# Patient Record
Sex: Male | Born: 1950 | Race: White | Hispanic: No | Marital: Married | Smoking: Never smoker
Health system: Southern US, Community
[De-identification: ages and names within clinical notes are randomized; demographics above are authoritative.]

## PROBLEM LIST (undated history)

## (undated) DIAGNOSIS — G629 Polyneuropathy, unspecified: Secondary | ICD-10-CM

## (undated) DIAGNOSIS — I10 Essential (primary) hypertension: Secondary | ICD-10-CM

## (undated) DIAGNOSIS — E1122 Type 2 diabetes mellitus with diabetic chronic kidney disease: Secondary | ICD-10-CM

## (undated) DIAGNOSIS — N183 Chronic kidney disease, stage 3 (moderate): Secondary | ICD-10-CM

## (undated) DIAGNOSIS — E119 Type 2 diabetes mellitus without complications: Secondary | ICD-10-CM

## (undated) DIAGNOSIS — I639 Cerebral infarction, unspecified: Secondary | ICD-10-CM

## (undated) DIAGNOSIS — E7800 Pure hypercholesterolemia, unspecified: Secondary | ICD-10-CM

## (undated) DIAGNOSIS — E538 Deficiency of other specified B group vitamins: Secondary | ICD-10-CM

## (undated) HISTORY — DX: Cerebral infarction, unspecified: I63.9

---

## 2013-07-28 ENCOUNTER — Encounter: Payer: Self-pay | Admitting: Cardiology

## 2013-07-31 ENCOUNTER — Encounter: Payer: Self-pay | Admitting: General Surgery

## 2013-11-15 DIAGNOSIS — I10 Essential (primary) hypertension: Secondary | ICD-10-CM | POA: Diagnosis not present

## 2013-11-29 ENCOUNTER — Ambulatory Visit: Payer: Self-pay | Admitting: Cardiology

## 2014-03-27 DIAGNOSIS — L255 Unspecified contact dermatitis due to plants, except food: Secondary | ICD-10-CM | POA: Diagnosis not present

## 2014-05-07 DIAGNOSIS — E538 Deficiency of other specified B group vitamins: Secondary | ICD-10-CM | POA: Diagnosis not present

## 2014-05-07 DIAGNOSIS — G609 Hereditary and idiopathic neuropathy, unspecified: Secondary | ICD-10-CM | POA: Diagnosis not present

## 2014-05-07 DIAGNOSIS — Z125 Encounter for screening for malignant neoplasm of prostate: Secondary | ICD-10-CM | POA: Diagnosis not present

## 2014-05-07 DIAGNOSIS — R5381 Other malaise: Secondary | ICD-10-CM | POA: Diagnosis not present

## 2014-05-07 DIAGNOSIS — Z0000 Encounter for general adult medical examination without abnormal findings: Secondary | ICD-10-CM | POA: Diagnosis not present

## 2014-05-07 DIAGNOSIS — Z23 Encounter for immunization: Secondary | ICD-10-CM | POA: Diagnosis not present

## 2014-05-07 DIAGNOSIS — R5383 Other fatigue: Secondary | ICD-10-CM | POA: Diagnosis not present

## 2014-05-07 DIAGNOSIS — F329 Major depressive disorder, single episode, unspecified: Secondary | ICD-10-CM | POA: Diagnosis not present

## 2014-05-07 DIAGNOSIS — R0602 Shortness of breath: Secondary | ICD-10-CM | POA: Diagnosis not present

## 2014-05-07 DIAGNOSIS — F3289 Other specified depressive episodes: Secondary | ICD-10-CM | POA: Diagnosis not present

## 2014-05-07 DIAGNOSIS — I10 Essential (primary) hypertension: Secondary | ICD-10-CM | POA: Diagnosis not present

## 2014-05-07 DIAGNOSIS — R32 Unspecified urinary incontinence: Secondary | ICD-10-CM | POA: Diagnosis not present

## 2014-05-07 DIAGNOSIS — E785 Hyperlipidemia, unspecified: Secondary | ICD-10-CM | POA: Diagnosis not present

## 2014-05-15 DIAGNOSIS — N139 Obstructive and reflux uropathy, unspecified: Secondary | ICD-10-CM | POA: Diagnosis not present

## 2014-05-15 DIAGNOSIS — R350 Frequency of micturition: Secondary | ICD-10-CM | POA: Diagnosis not present

## 2014-05-15 DIAGNOSIS — N401 Enlarged prostate with lower urinary tract symptoms: Secondary | ICD-10-CM | POA: Diagnosis not present

## 2014-05-15 DIAGNOSIS — N3941 Urge incontinence: Secondary | ICD-10-CM | POA: Diagnosis not present

## 2014-05-15 DIAGNOSIS — N138 Other obstructive and reflux uropathy: Secondary | ICD-10-CM | POA: Diagnosis not present

## 2014-08-06 DIAGNOSIS — N183 Chronic kidney disease, stage 3 (moderate): Secondary | ICD-10-CM | POA: Diagnosis not present

## 2014-08-06 DIAGNOSIS — E1121 Type 2 diabetes mellitus with diabetic nephropathy: Secondary | ICD-10-CM | POA: Diagnosis not present

## 2014-08-06 DIAGNOSIS — Z5181 Encounter for therapeutic drug level monitoring: Secondary | ICD-10-CM | POA: Diagnosis not present

## 2014-08-06 DIAGNOSIS — E785 Hyperlipidemia, unspecified: Secondary | ICD-10-CM | POA: Diagnosis not present

## 2014-08-06 DIAGNOSIS — I10 Essential (primary) hypertension: Secondary | ICD-10-CM | POA: Diagnosis not present

## 2014-08-06 DIAGNOSIS — F339 Major depressive disorder, recurrent, unspecified: Secondary | ICD-10-CM | POA: Diagnosis not present

## 2014-12-05 ENCOUNTER — Observation Stay (HOSPITAL_COMMUNITY): Payer: Medicare Other

## 2014-12-05 ENCOUNTER — Emergency Department (HOSPITAL_COMMUNITY): Payer: Medicare Other

## 2014-12-05 ENCOUNTER — Encounter (HOSPITAL_COMMUNITY): Payer: Self-pay | Admitting: Family Medicine

## 2014-12-05 ENCOUNTER — Observation Stay (HOSPITAL_COMMUNITY)
Admission: EM | Admit: 2014-12-05 | Discharge: 2014-12-06 | Disposition: A | Discharge status: Home / Self Care | Payer: Medicare Other | Attending: Internal Medicine | Admitting: Internal Medicine

## 2014-12-05 DIAGNOSIS — G459 Transient cerebral ischemic attack, unspecified: Principal | ICD-10-CM | POA: Insufficient documentation

## 2014-12-05 DIAGNOSIS — Z7982 Long term (current) use of aspirin: Secondary | ICD-10-CM | POA: Insufficient documentation

## 2014-12-05 DIAGNOSIS — N183 Chronic kidney disease, stage 3 unspecified: Secondary | ICD-10-CM

## 2014-12-05 DIAGNOSIS — E538 Deficiency of other specified B group vitamins: Secondary | ICD-10-CM | POA: Diagnosis not present

## 2014-12-05 DIAGNOSIS — I129 Hypertensive chronic kidney disease with stage 1 through stage 4 chronic kidney disease, or unspecified chronic kidney disease: Secondary | ICD-10-CM | POA: Insufficient documentation

## 2014-12-05 DIAGNOSIS — G629 Polyneuropathy, unspecified: Secondary | ICD-10-CM

## 2014-12-05 DIAGNOSIS — R270 Ataxia, unspecified: Secondary | ICD-10-CM | POA: Diagnosis not present

## 2014-12-05 DIAGNOSIS — E7800 Pure hypercholesterolemia, unspecified: Secondary | ICD-10-CM

## 2014-12-05 DIAGNOSIS — R05 Cough: Secondary | ICD-10-CM

## 2014-12-05 DIAGNOSIS — R278 Other lack of coordination: Secondary | ICD-10-CM | POA: Diagnosis not present

## 2014-12-05 DIAGNOSIS — R531 Weakness: Secondary | ICD-10-CM | POA: Diagnosis present

## 2014-12-05 DIAGNOSIS — R202 Paresthesia of skin: Secondary | ICD-10-CM | POA: Diagnosis not present

## 2014-12-05 DIAGNOSIS — I10 Essential (primary) hypertension: Secondary | ICD-10-CM | POA: Diagnosis not present

## 2014-12-05 DIAGNOSIS — R059 Cough, unspecified: Secondary | ICD-10-CM

## 2014-12-05 DIAGNOSIS — R29810 Facial weakness: Secondary | ICD-10-CM | POA: Diagnosis not present

## 2014-12-05 DIAGNOSIS — R208 Other disturbances of skin sensation: Secondary | ICD-10-CM

## 2014-12-05 DIAGNOSIS — E119 Type 2 diabetes mellitus without complications: Secondary | ICD-10-CM | POA: Diagnosis not present

## 2014-12-05 DIAGNOSIS — E1122 Type 2 diabetes mellitus with diabetic chronic kidney disease: Secondary | ICD-10-CM | POA: Diagnosis not present

## 2014-12-05 DIAGNOSIS — N289 Disorder of kidney and ureter, unspecified: Secondary | ICD-10-CM | POA: Insufficient documentation

## 2014-12-05 DIAGNOSIS — R200 Anesthesia of skin: Secondary | ICD-10-CM | POA: Diagnosis not present

## 2014-12-05 DIAGNOSIS — I639 Cerebral infarction, unspecified: Secondary | ICD-10-CM | POA: Diagnosis not present

## 2014-12-05 DIAGNOSIS — E785 Hyperlipidemia, unspecified: Secondary | ICD-10-CM | POA: Insufficient documentation

## 2014-12-05 HISTORY — DX: Chronic kidney disease, stage 3 (moderate): N18.3

## 2014-12-05 HISTORY — DX: Deficiency of other specified B group vitamins: E53.8

## 2014-12-05 HISTORY — DX: Type 2 diabetes mellitus without complications: E11.9

## 2014-12-05 HISTORY — DX: Chronic kidney disease, stage 3 unspecified: N18.30

## 2014-12-05 HISTORY — DX: Polyneuropathy, unspecified: G62.9

## 2014-12-05 HISTORY — DX: Pure hypercholesterolemia, unspecified: E78.00

## 2014-12-05 HISTORY — DX: Type 2 diabetes mellitus with diabetic chronic kidney disease: E11.22

## 2014-12-05 HISTORY — DX: Essential (primary) hypertension: I10

## 2014-12-05 LAB — COMPREHENSIVE METABOLIC PANEL WITH GFR
ALT: 25 U/L (ref 0–53)
AST: 20 U/L (ref 0–37)
Albumin: 4 g/dL (ref 3.5–5.2)
Alkaline Phosphatase: 81 U/L (ref 39–117)
Anion gap: 7 (ref 5–15)
BUN: 24 mg/dL — ABNORMAL HIGH (ref 6–23)
CO2: 25 mmol/L (ref 19–32)
Calcium: 9.9 mg/dL (ref 8.4–10.5)
Chloride: 109 mmol/L (ref 96–112)
Creatinine, Ser: 1.96 mg/dL — ABNORMAL HIGH (ref 0.50–1.35)
GFR calc Af Amer: 39 mL/min — ABNORMAL LOW
GFR calc non Af Amer: 34 mL/min — ABNORMAL LOW
Glucose, Bld: 267 mg/dL — ABNORMAL HIGH (ref 70–99)
Potassium: 4.2 mmol/L (ref 3.5–5.1)
Sodium: 141 mmol/L (ref 135–145)
Total Bilirubin: 0.7 mg/dL (ref 0.3–1.2)
Total Protein: 6.7 g/dL (ref 6.0–8.3)

## 2014-12-05 LAB — CBC
HCT: 38.6 % — ABNORMAL LOW (ref 39.0–52.0)
HEMATOCRIT: 39 % (ref 39.0–52.0)
Hemoglobin: 12.8 g/dL — ABNORMAL LOW (ref 13.0–17.0)
Hemoglobin: 12.9 g/dL — ABNORMAL LOW (ref 13.0–17.0)
MCH: 30 pg (ref 26.0–34.0)
MCH: 29.9 pg (ref 26.0–34.0)
MCHC: 33.2 g/dL (ref 30.0–36.0)
MCHC: 33.1 g/dL (ref 30.0–36.0)
MCV: 90.4 fL (ref 78.0–100.0)
MCV: 90.5 fL (ref 78.0–100.0)
PLATELETS: 212 10*3/uL (ref 150–400)
Platelets: 209 10*3/uL (ref 150–400)
RBC: 4.27 MIL/uL (ref 4.22–5.81)
RBC: 4.31 MIL/uL (ref 4.22–5.81)
RDW: 13.5 % (ref 11.5–15.5)
RDW: 13.4 % (ref 11.5–15.5)
WBC: 10.7 10*3/uL — AB (ref 4.0–10.5)
WBC: 9.9 10*3/uL (ref 4.0–10.5)

## 2014-12-05 LAB — URINALYSIS, ROUTINE W REFLEX MICROSCOPIC
BILIRUBIN URINE: NEGATIVE
Glucose, UA: NEGATIVE mg/dL
Hgb urine dipstick: NEGATIVE
KETONES UR: NEGATIVE mg/dL
Leukocytes, UA: NEGATIVE
Nitrite: NEGATIVE
PH: 6.5 (ref 5.0–8.0)
Protein, ur: NEGATIVE mg/dL
Specific Gravity, Urine: 1.006 (ref 1.005–1.030)
UROBILINOGEN UA: 0.2 mg/dL (ref 0.0–1.0)

## 2014-12-05 LAB — DIFFERENTIAL
BASOS PCT: 0 % (ref 0–1)
Basophils Absolute: 0 10*3/uL (ref 0.0–0.1)
Eosinophils Absolute: 0.4 10*3/uL (ref 0.0–0.7)
Eosinophils Relative: 3 % (ref 0–5)
Lymphocytes Relative: 18 % (ref 12–46)
Lymphs Abs: 1.9 10*3/uL (ref 0.7–4.0)
MONO ABS: 0.5 10*3/uL (ref 0.1–1.0)
Monocytes Relative: 5 % (ref 3–12)
NEUTROS ABS: 7.9 10*3/uL — AB (ref 1.7–7.7)
Neutrophils Relative %: 74 % (ref 43–77)

## 2014-12-05 LAB — TSH: TSH: 3.212 u[IU]/mL (ref 0.350–4.500)

## 2014-12-05 LAB — I-STAT TROPONIN, ED: Troponin i, poc: 0 ng/mL (ref 0.00–0.08)

## 2014-12-05 LAB — RETICULOCYTES
RBC.: 4.31 MIL/uL (ref 4.22–5.81)
RETIC CT PCT: 1.5 % (ref 0.4–3.1)
Retic Count, Absolute: 64.7 10*3/uL (ref 19.0–186.0)

## 2014-12-05 LAB — CREATININE, SERUM
Creatinine, Ser: 1.84 mg/dL — ABNORMAL HIGH (ref 0.50–1.35)
GFR calc Af Amer: 42 mL/min — ABNORMAL LOW (ref >90)
GFR calc non Af Amer: 37 mL/min — ABNORMAL LOW (ref >90)

## 2014-12-05 LAB — CBG MONITORING, ED: Glucose-Capillary: 238 mg/dL — ABNORMAL HIGH (ref 70–99)

## 2014-12-05 LAB — PROTIME-INR
INR: 0.99 (ref 0.00–1.49)
Prothrombin Time: 13.2 s (ref 11.6–15.2)

## 2014-12-05 LAB — APTT: APTT: 30 s (ref 24–37)

## 2014-12-05 LAB — OSMOLALITY, URINE: OSMOLALITY UR: 213 mosm/kg — AB (ref 390–1090)

## 2014-12-05 LAB — SODIUM, URINE, RANDOM: Sodium, Ur: 42 mmol/L

## 2014-12-05 LAB — CREATININE, URINE, RANDOM: Creatinine, Urine: 29.73 mg/dL

## 2014-12-05 LAB — GLUCOSE, CAPILLARY: GLUCOSE-CAPILLARY: 143 mg/dL — AB (ref 70–99)

## 2014-12-05 MED ORDER — PRAVASTATIN SODIUM 20 MG PO TABS
20.0000 mg | ORAL_TABLET | Freq: Every day | ORAL | Status: DC
  Administered 2014-12-05 – 2014-12-06 (×2): 20 mg via ORAL
  Filled 2014-12-05 (×2): qty 1

## 2014-12-05 MED ORDER — SODIUM CHLORIDE 0.9 % IJ SOLN: 3.0000 mL | Freq: Two times a day (BID) | INTRAMUSCULAR | Status: DC

## 2014-12-05 MED ORDER — VITAMIN B-12 1000 MCG PO TABS
1000.0000 ug | ORAL_TABLET | Freq: Every day | ORAL | Status: DC
  Administered 2014-12-05 – 2014-12-06 (×2): 1000 ug via ORAL
  Filled 2014-12-05 (×2): qty 1

## 2014-12-05 MED ORDER — ASPIRIN 81 MG PO TABS: 81.0000 mg | ORAL_TABLET | Freq: Every day | ORAL | Status: DC

## 2014-12-05 MED ORDER — GUAIFENESIN-DM 100-10 MG/5ML PO SYRP: 5.0000 mL | ORAL_SOLUTION | ORAL | Status: DC | PRN

## 2014-12-05 MED ORDER — ENOXAPARIN SODIUM 30 MG/0.3ML ~~LOC~~ SOLN
30.0000 mg | SUBCUTANEOUS | Status: DC
  Administered 2014-12-05: 30 mg via SUBCUTANEOUS
  Filled 2014-12-05: qty 0.3

## 2014-12-05 MED ORDER — PROPRANOLOL HCL 80 MG PO TABS
80.0000 mg | ORAL_TABLET | Freq: Two times a day (BID) | ORAL | Status: DC
  Administered 2014-12-05 – 2014-12-06 (×2): 80 mg via ORAL
  Filled 2014-12-05 (×4): qty 1

## 2014-12-05 MED ORDER — INSULIN ASPART 100 UNIT/ML ~~LOC~~ SOLN
0.0000 [IU] | Freq: Three times a day (TID) | SUBCUTANEOUS | Status: DC
  Administered 2014-12-06: 7 [IU] via SUBCUTANEOUS
  Administered 2014-12-06: 2 [IU] via SUBCUTANEOUS

## 2014-12-05 MED ORDER — ASPIRIN 81 MG PO CHEW
81.0000 mg | CHEWABLE_TABLET | Freq: Once | ORAL | Status: AC
  Administered 2014-12-05: 81 mg via ORAL
  Filled 2014-12-05: qty 1

## 2014-12-05 MED ORDER — HYDROCODONE-ACETAMINOPHEN 5-325 MG PO TABS: 1.0000 | ORAL_TABLET | ORAL | Status: DC | PRN

## 2014-12-05 MED ORDER — ONDANSETRON HCL 4 MG/2ML IJ SOLN: 4.0000 mg | Freq: Four times a day (QID) | INTRAMUSCULAR | Status: DC | PRN

## 2014-12-05 MED ORDER — SODIUM CHLORIDE 0.9 % IV SOLN
INTRAVENOUS | Status: AC
  Administered 2014-12-05 – 2014-12-06 (×2): 75 mL/h via INTRAVENOUS

## 2014-12-05 MED ORDER — ASPIRIN 81 MG PO CHEW
81.0000 mg | CHEWABLE_TABLET | Freq: Every day | ORAL | Status: DC
  Administered 2014-12-05: 81 mg via ORAL
  Filled 2014-12-05: qty 1

## 2014-12-05 MED ORDER — ENOXAPARIN SODIUM 40 MG/0.4ML ~~LOC~~ SOLN: 40.0000 mg | SUBCUTANEOUS | Status: DC

## 2014-12-05 MED ORDER — HYDRALAZINE HCL 20 MG/ML IJ SOLN
10.0000 mg | Freq: Once | INTRAMUSCULAR | Status: AC
  Administered 2014-12-06: 10 mg via INTRAVENOUS
  Filled 2014-12-05: qty 1

## 2014-12-05 MED ORDER — POLYETHYLENE GLYCOL 3350 17 G PO PACK: 17.0000 g | PACK | Freq: Every day | ORAL | Status: DC | PRN

## 2014-12-05 MED ORDER — INSULIN ASPART 100 UNIT/ML ~~LOC~~ SOLN: 0.0000 [IU] | Freq: Every day | SUBCUTANEOUS | Status: DC

## 2014-12-05 MED ORDER — LITHIUM CARBONATE 300 MG PO CAPS
300.0000 mg | ORAL_CAPSULE | Freq: Two times a day (BID) | ORAL | Status: DC
  Administered 2014-12-05 – 2014-12-06 (×2): 300 mg via ORAL
  Filled 2014-12-05 (×4): qty 1

## 2014-12-05 MED ORDER — ALUM & MAG HYDROXIDE-SIMETH 200-200-20 MG/5ML PO SUSP: 30.0000 mL | Freq: Four times a day (QID) | ORAL | Status: DC | PRN

## 2014-12-05 MED ORDER — ONDANSETRON HCL 4 MG PO TABS: 4.0000 mg | ORAL_TABLET | Freq: Four times a day (QID) | ORAL | Status: DC | PRN

## 2014-12-05 NOTE — H&P (Addendum)
Patient Demographics  David Glass, is a 64 y.o. male  MRN: [id number]   DOB - 1951-05-21  Admit Date - 12/05/2014  Outpatient Primary MD for the patient is WEBB, Valla Leaver, MD   With History of -  Past Medical History  Diagnosis Date  . Hypertension   . High cholesterol   . Diabetes mellitus without complication   . CKD stage 3 due to type 2 diabetes mellitus 12/05/2014  . Neuropathy, peripheral 12/05/2014  . B12 deficiency 12/05/2014      History reviewed. No pertinent past surgical history.  in for   Chief Complaint  Patient presents with  . Stroke Symptoms     HPI  David Glass  is a 64 y.o. male, with history of essential hypertension, dyslipidemia, type 2 diabetes mellitus, peripheral neuropathy, CK D stage III, who comes to the hospital with 1 day history of right-sided tingling numbness starting sometime last night, he had similar symptoms which were bilateral one year ago and went away after a day, since his symptoms did not improve he came to the ER.   In the ER head CT, initial blood work and chest x-ray were unremarkable, he was seen by neurology and I was asked to admit the patient. Of note patient has no weakness, no focal deficits on exam, neurology is contemplating to do baseline MRI and then further testing based on the MRI results.    Review of Systems    In addition to the HPI above,  No Fever-chills, No Headache, No changes with Vision or hearing, No problems swallowing food or Liquids, No Chest pain, Cough or Shortness of Breath, No Abdominal pain, No Nausea or Vommitting, Bowel movements are regular, No Blood in stool or Urine, No dysuria, No new skin rashes or bruises, No new joints pains-aches,  No new weakness, tingling, numbness in any extremity, except as above No recent  weight gain or loss, No polyuria, polydypsia or polyphagia, No significant Mental Stressors.  A full 10 point Review of Systems was done, except as stated above, all other Review of Systems were negative.   Social History History  Substance Use Topics  . Smoking status: Never Smoker   . Smokeless tobacco: Not on file  . Alcohol Use: No      Family History Family History  Problem Relation Age of Onset  . Hyperlipidemia Mother   . Hypertension Mother   . Hypertension Father   . Hyperlipidemia Father       Prior to Admission medications   Medication Sig Start Date End Date Taking? Authorizing Provider  aspirin 81 MG tablet Take 81 mg by mouth daily.   Yes Historical Provider, MD  Cyanocobalamin (VITAMIN B 12 PO) Take 1 tablet by mouth daily.   Yes Historical Provider, MD  lisinopril (PRINIVIL,ZESTRIL) 10 MG tablet Take 10 mg by mouth daily. 11/22/14  Yes Historical Provider, MD  lithium carbonate 300 MG capsule  Take 300 mg by mouth 2 (two) times daily. Take 600 mg in the morning and 300 mg at bedtime   Yes Historical Provider, MD  metFORMIN (GLUCOPHAGE) 1000 MG tablet Take 1,000 mg by mouth 2 (two) times daily with a meal.   Yes Historical Provider, MD  pravastatin (PRAVACHOL) 20 MG tablet Take 20 mg by mouth daily. 11/08/14  Yes Historical Provider, MD  propranolol (INDERAL) 80 MG tablet Take 80 mg by mouth 2 (two) times daily. 10/29/14  Yes Historical Provider, MD    Allergies  Allergen Reactions  . Alka-Seltzer Plus Cold [Chlorphen-Phenyleph-Asa]     Physical Exam  Vitals  Blood pressure 139/66, pulse 66, temperature 98.9 F (37.2 C), resp. rate 17, height 6\' 3"  (1.905 m), weight 107.049 kg (236 lb), SpO2 98 %.   1. General middle-aged well-built white male lying in bed in NAD,     2. Normal affect and insight, Not Suicidal or Homicidal, Awake Alert, Oriented X 3.  3. No F.N deficits, ALL C.Nerves Intact, Strength 5/5 all 4 extremities, Sensation intact all 4  extremities, Plantars down going.  4. Ears and Eyes appear Normal, Conjunctivae clear, PERRLA. Moist Oral Mucosa.  5. Supple Neck, No JVD, No cervical lymphadenopathy appriciated, No Carotid Bruits.  6. Symmetrical Chest wall movement, Good air movement bilaterally, CTAB.  7. RRR, No Gallops, Rubs or Murmurs, No Parasternal Heave.  8. Positive Bowel Sounds, Abdomen Soft, No tenderness, No organomegaly appriciated,No rebound -guarding or rigidity.  9.  No Cyanosis, Normal Skin Turgor, No Skin Rash or Bruise.  10. Good muscle tone,  joints appear normal , no effusions, Normal ROM.  11. No Palpable Lymph Nodes in Neck or Axillae     Data Review  CBC  Recent Labs Lab 12/05/14 1140  WBC 10.7*  HGB 12.8*  HCT 38.6*  PLT 212  MCV 90.4  MCH 30.0  MCHC 33.2  RDW 13.5  LYMPHSABS 1.9  MONOABS 0.5  EOSABS 0.4  BASOSABS 0.0   ------------------------------------------------------------------------------------------------------------------  Chemistries   Recent Labs Lab 12/05/14 1140  NA 141  K 4.2  CL 109  CO2 25  GLUCOSE 267*  BUN 24*  CREATININE 1.96*  CALCIUM 9.9  AST 20  ALT 25  ALKPHOS 81  BILITOT 0.7   ------------------------------------------------------------------------------------------------------------------ estimated creatinine clearance is 49 mL/min (by C-G formula based on Cr of 1.96). ------------------------------------------------------------------------------------------------------------------ No results for input(s): TSH, T4TOTAL, T3FREE, THYROIDAB in the last 72 hours.  Invalid input(s): FREET3   Coagulation profile  Recent Labs Lab 12/05/14 1140  INR 0.99   ------------------------------------------------------------------------------------------------------------------- No results for input(s): DDIMER in the last 72  hours. -------------------------------------------------------------------------------------------------------------------  Cardiac Enzymes No results for input(s): CKMB, TROPONINI, MYOGLOBIN in the last 168 hours.  Invalid input(s): CK ------------------------------------------------------------------------------------------------------------------ Invalid input(s): POCBNP   ---------------------------------------------------------------------------------------------------------------  Urinalysis No results found for: COLORURINE, APPEARANCEUR, LABSPEC, Rennert, GLUCOSEU, HGBUR, BILIRUBINUR, KETONESUR, PROTEINUR, UROBILINOGEN, NITRITE, LEUKOCYTESUR  ----------------------------------------------------------------------------------------------------------------  Imaging results:   Ct Head (brain) Wo Contrast  12/05/2014   CLINICAL DATA:  64 year old male with stroke symptoms. Right side numbness since yesterday. Initial encounter.  EXAM: CT HEAD WITHOUT CONTRAST  TECHNIQUE: Contiguous axial images were obtained from the base of the skull through the vertex without intravenous contrast.  COMPARISON:  None.  FINDINGS: Visualized paranasal sinuses and mastoids are clear. No acute osseous abnormality identified. Visualized orbits and scalp soft tissues are within normal limits. Calcified atherosclerosis at the skull base.  No midline shift, mass effect, or evidence of intracranial mass lesion. No ventriculomegaly.  No evidence of cortically based acute infarction identified. Normal for age gray-white matter differentiation. No suspicious intracranial vascular hyperdensity.  IMPRESSION: Normal for age non contrast CT appearance of the brain.   Electronically Signed   By: Genevie Ann M.D.   On: 12/05/2014 13:09    My personal review of EKG: Rhythm NSR, Rate  67/min,  no Acute ST changes    Assessment & Plan   1. Right-sided tingling numbness without any weakness. Question TIA versus neuropathy  (although unilateral) - will be kept and 23 observation on a telemetry bed, continue aspirin 81 mg, further neurological workup will be deferred to neurology service. Will check TSH and B-12.   2. Peripheral neuropathy with vitamin B-12 deficiency. Check vitamin B-12 level, check TSH, place on B-12 supplementation.   3. DM type II. Check A1c, place on sliding scale hold Glucophage.   4. Anemia. He has never had colonoscopy, will check anemia panel, will request PCP to do age-appropriate anemia workup in the outpatient setting along with consider colonoscopy.   5. CK D stage III. This was on patient's progress note from PCP office. Baseline creatinine not known, will have records specially last BMP obtained from PCP office, for now gently hydrate, this very well could be his baseline creatinine. No creatinine in our system.   6. Mild leukocytosis. Afebrile. No cough or body aches, check UA and baseline chest x-ray, repeat CBC in the morning.      DVT Prophylaxis   Lovenox    AM Labs Ordered, also please review Full Orders  Family Communication: Admission, patients condition and plan of care including tests being ordered have been discussed with the patient and wife who indicate understanding and agree with the plan and Code Status.  Code Status Full  Likely DC to Home  Condition Fair  Time spent in minutes : 35    Andris Brothers K M.D on 12/05/2014 at 1:53 PM  Between 7am to 7pm - Pager - [phone]  After 7pm go to [email or web address] - password [email or web address]  [facility]  Office  [phone]

## 2014-12-05 NOTE — Consult Note (Signed)
Referring Physician: Pollina    Chief Complaint: right sided tingling  HPI:                                                                                                                                         David Glass is an 64 y.o. male presenting to ED after he noted he had a "tingling sensation in this right arm and leg yesterday.  HE noted that the sensation increased as the day went on.  He stated his right hand was noticeably different last night. Last night he noted his hand was not as strong and he was clumsy with his right hand. This AM he awoke and noted both arm and leg had more of a sensory change. HE went to his PCP who sent him to the ER.    He has had similar symptoms in bilateral arms about a year ago.  Symptoms at that time lasted for about one hour.  He never went to a MD about this.  He is in ASA and takes it daily.   Date last known well: Date: 12/04/2014 Time last known well: Unable to determine tPA Given: No: out of window Modified Rankin: Rankin Score=0    Past Medical History  Diagnosis Date  . Hypertension   . High cholesterol   . Diabetes mellitus without complication     History reviewed. No pertinent past surgical history.  Family History  Problem Relation Age of Onset  . Hyperlipidemia Mother   . Hypertension Mother   . Hypertension Father   . Hyperlipidemia Father    Social History:  reports that he has never smoked. He does not have any smokeless tobacco history on file. He reports that he does not drink alcohol or use illicit drugs.  Allergies:  Allergies  Allergen Reactions  . Alka-Seltzer Plus Cold [Chlorphen-Phenyleph-Asa]     Medications:                                                                                                                           Current Facility-Administered Medications  Medication Dose Route Frequency Provider Last Rate Last Dose  . 0.9 %  sodium chloride infusion   Intravenous Continuous Thurnell Lose, MD       Current Outpatient Prescriptions  Medication Sig Dispense Refill  . aspirin 81 MG tablet Take 81  mg by mouth daily.    . Cyanocobalamin (VITAMIN B 12 PO) Take 1 tablet by mouth daily.    Marland Kitchen lisinopril (PRINIVIL,ZESTRIL) 10 MG tablet Take 10 mg by mouth daily.  11  . lithium carbonate 300 MG capsule Take 300 mg by mouth 2 (two) times daily. Take 600 mg in the morning and 300 mg at bedtime    . metFORMIN (GLUCOPHAGE) 1000 MG tablet Take 1,000 mg by mouth 2 (two) times daily with a meal.    . pravastatin (PRAVACHOL) 20 MG tablet Take 20 mg by mouth daily.  11  . propranolol (INDERAL) 80 MG tablet Take 80 mg by mouth 2 (two) times daily.  2     ROS:                                                                                                                                       History obtained from the patient  General ROS: negative for - chills, fatigue, fever, night sweats, weight gain or weight loss Psychological ROS: negative for - behavioral disorder, hallucinations, memory difficulties, mood swings or suicidal ideation Ophthalmic ROS: negative for - blurry vision, double vision, eye pain or loss of vision ENT ROS: negative for - epistaxis, nasal discharge, oral lesions, sore throat, tinnitus or vertigo Allergy and Immunology ROS: negative for - hives or itchy/watery eyes Hematological and Lymphatic ROS: negative for - bleeding problems, bruising or swollen lymph nodes Endocrine ROS: negative for - galactorrhea, hair pattern changes, polydipsia/polyuria or temperature intolerance Respiratory ROS: negative for - cough, hemoptysis, shortness of breath or wheezing Cardiovascular ROS: negative for - chest pain, dyspnea on exertion, edema or irregular heartbeat Gastrointestinal ROS: negative for - abdominal pain, diarrhea, hematemesis, nausea/vomiting or stool incontinence Genito-Urinary ROS: negative for - dysuria, hematuria, incontinence or urinary  frequency/urgency Musculoskeletal ROS: negative for - joint swelling or muscular weakness Neurological ROS: as noted in HPI Dermatological ROS: negative for rash and skin lesion changes  Neurologic Examination:                                                                                                      Blood pressure 139/66, pulse 66, temperature 98.9 F (37.2 C), resp. rate 17, height 6\' 3"  (1.905 m), weight 107.049 kg (236 lb), SpO2 98 %.  HEENT-  Normocephalic, no lesions, without obvious abnormality.  Normal external eye and conjunctiva.  Normal TM's bilaterally.  Normal auditory canals and external ears. Normal external nose, mucus membranes and  septum.  Normal pharynx. Cardiovascular- S1, S2 normal, pulses palpable throughout   Lungs- chest clear, no wheezing, rales, normal symmetric air entry Abdomen- normal findings: bowel sounds normal Extremities- no edema Lymph-no adenopathy palpable Musculoskeletal-no joint tenderness, deformity or swelling Skin-warm and dry, no hyperpigmentation, vitiligo, or suspicious lesions  Neurological Examination Mental Status: Alert, oriented, thought content appropriate.  Speech fluent without evidence of aphasia.  Able to follow 3 step commands without difficulty. Cranial Nerves: II: Discs flat bilaterally; Visual fields grossly normal, pupils equal, round, reactive to light and accommodation III,IV, VI: ptosis not present, extra-ocular motions intact bilaterally V,VII: smile symmetric, facial light touch sensation normal bilaterally VIII: hearing normal bilaterally IX,X: uvula rises symmetrically XI: bilateral shoulder shrug XII: midline tongue extension Motor: Right : Upper extremity   5/5    Left:     Upper extremity   5/5  Lower extremity   5/5     Lower extremity   5/5 Tone and bulk:normal tone throughout; no atrophy noted Sensory: Pinprick and light touch decreased on the right arm and leg Deep Tendon Reflexes: 2+ and symmetric  throughout UE and KJ no AJ Plantars: Right: downgoing   Left: downgoing Cerebellar: normal finger-to-nose,  normal heel-to-shin test Gait: not assessed due to multiple leads.        Lab Results: Basic Metabolic Panel:  Recent Labs Lab 12/05/14 1140  NA 141  K 4.2  CL 109  CO2 25  GLUCOSE 267*  BUN 24*  CREATININE 1.96*  CALCIUM 9.9    Liver Function Tests:  Recent Labs Lab 12/05/14 1140  AST 20  ALT 25  ALKPHOS 81  BILITOT 0.7  PROT 6.7  ALBUMIN 4.0   No results for input(s): LIPASE, AMYLASE in the last 168 hours. No results for input(s): AMMONIA in the last 168 hours.  CBC:  Recent Labs Lab 12/05/14 1140  WBC 10.7*  NEUTROABS 7.9*  HGB 12.8*  HCT 38.6*  MCV 90.4  PLT 212    Cardiac Enzymes: No results for input(s): CKTOTAL, CKMB, CKMBINDEX, TROPONINI in the last 168 hours.  Lipid Panel: No results for input(s): CHOL, TRIG, HDL, CHOLHDL, VLDL, LDLCALC in the last 168 hours.  CBG:  Recent Labs Lab 12/05/14 1250  GLUCAP 238*    Microbiology: No results found for this or any previous visit.  Coagulation Studies:  Recent Labs  12/05/14 1140  LABPROT 13.2  INR 0.99    Imaging: Ct Head (brain) Wo Contrast  12/05/2014   CLINICAL DATA:  64 year old male with stroke symptoms. Right side numbness since yesterday. Initial encounter.  EXAM: CT HEAD WITHOUT CONTRAST  TECHNIQUE: Contiguous axial images were obtained from the base of the skull through the vertex without intravenous contrast.  COMPARISON:  None.  FINDINGS: Visualized paranasal sinuses and mastoids are clear. No acute osseous abnormality identified. Visualized orbits and scalp soft tissues are within normal limits. Calcified atherosclerosis at the skull base.  No midline shift, mass effect, or evidence of intracranial mass lesion. No ventriculomegaly. No evidence of cortically based acute infarction identified. Normal for age gray-white matter differentiation. No suspicious  intracranial vascular hyperdensity.  IMPRESSION: Normal for age non contrast CT appearance of the brain.   Electronically Signed   By: Genevie Ann M.D.   On: 12/05/2014 13:09       Assessment and plan discussed with with attending physician and they are in agreement.    Etta Quill PA-C [facility] [phone]  12/05/2014, 1:47 PM   Assessment: 64 y.o.  male presenting with >24 hours of right arm and leg decreased sensation.  He also notes episode of weakness/decreased dexterity with right hand yesterday. This has resolved. Exam positive for decreased sensation in right arm and leg.  CT head negative for bleed or acute infarct. Will plan for stroke/TIA workup.   Stroke Risk Factors - diabetes mellitus, hyperlipidemia and hypertension  1. HgbA1c, fasting lipid panel 2. MRI, MRA  of the brain without contrast 3. Frequent neuro checks 4. Echocardiogram 5. Carotid dopplers 6. Prophylactic therapy-Antiplatelet med: Aspirin - dose 325mg  PO or 300mg  PR 7. Risk factor modification 8. Telemetry monitoring 9. PT consult, OT consult, Speech consult 10. NPO until RN stroke swallow screen  Jim Like, DO [facility] [phone]  If 7pm- 7am, please page neurology on call as listed in [place].

## 2014-12-05 NOTE — Progress Notes (Signed)
Progress Note: Code stroke called due to worsening of neurological examination.  Per nursing noted to have a new left facial droop and left sided dysmetria.  New NIHSS 5.  On my examination symptoms had resolved.  No facial droop or dysmetria noted.  NIHSS unchanged from admission at 1 due to right side numbness.   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  With acute infarct and resolution of symptoms patient not a tPA candidate.  Not an intervention candidate at this time as well.    Recommendations: 1.  Head CT without contrast STAT 2.  Continue ASA   Alexis Goodell, MD [facility] [phone] 12/05/2014  9:49 PM

## 2014-12-05 NOTE — ED Notes (Signed)
p here for right sided numbness in both extremities that started yesterday. Sent here by doctor. No other deficits noted at triage.

## 2014-12-05 NOTE — ED Provider Notes (Signed)
CSN: [id number]     Arrival date & time 12/05/14  1121 History   First MD Initiated Contact with Patient 12/05/14 1140     Chief Complaint  Patient presents with  . Stroke Symptoms     (Consider location/radiation/quality/duration/timing/severity/associated sxs/prior Treatment) HPI Comments: Patient presents to the ER for evaluation of right-sided numbness and weakness. Patient first noticed symptoms last night. He reports that they were worse when he woke up this morning. Patient has been dropping things with his right hand. He has not had any speech difficulty. He reports that the symptoms in the leg are only minor, no trouble walking. He does not have a headache.   Past Medical History  Diagnosis Date  . Hypertension   . High cholesterol   . Diabetes mellitus without complication    History reviewed. No pertinent past surgical history. History reviewed. No pertinent family history. History  Substance Use Topics  . Smoking status: Never Smoker   . Smokeless tobacco: Not on file  . Alcohol Use: No    Review of Systems  Neurological: Positive for weakness (right side) and numbness (right side).  All other systems reviewed and are negative.     Allergies  Alka-seltzer plus cold  Home Medications   Prior to Admission medications   Medication Sig Start Date End Date Taking? Authorizing Provider  aspirin 81 MG tablet Take 81 mg by mouth daily.   Yes Historical Provider, MD  Cyanocobalamin (VITAMIN B 12 PO) Take 1 tablet by mouth daily.   Yes Historical Provider, MD  lisinopril (PRINIVIL,ZESTRIL) 10 MG tablet Take 10 mg by mouth daily. 11/22/14  Yes Historical Provider, MD  lithium carbonate 300 MG capsule Take 300 mg by mouth 2 (two) times daily. Take 600 mg in the morning and 300 mg at bedtime   Yes Historical Provider, MD  metFORMIN (GLUCOPHAGE) 1000 MG tablet Take 1,000 mg by mouth 2 (two) times daily with a meal.   Yes Historical Provider, MD  pravastatin (PRAVACHOL) 20  MG tablet Take 20 mg by mouth daily. 11/08/14  Yes Historical Provider, MD  propranolol (INDERAL) 80 MG tablet Take 80 mg by mouth 2 (two) times daily. 10/29/14  Yes Historical Provider, MD   BP 164/81 mmHg  Pulse 64  Temp(Src) 98.9 F (37.2 C)  Resp 17  Ht 6\' 3"  (1.905 m)  Wt 236 lb (107.049 kg)  BMI 29.50 kg/m2  SpO2 94% Physical Exam  Constitutional: He is oriented to person, place, and time. He appears well-developed and well-nourished. No distress.  HENT:  Head: Normocephalic and atraumatic.  Right Ear: Hearing normal.  Left Ear: Hearing normal.  Nose: Nose normal.  Mouth/Throat: Oropharynx is clear and moist and mucous membranes are normal.  Eyes: Conjunctivae and EOM are normal. Pupils are equal, round, and reactive to light.  Neck: Normal range of motion. Neck supple.  Cardiovascular: Regular rhythm, S1 normal and S2 normal.  Exam reveals no gallop and no friction rub.   No murmur heard. Pulmonary/Chest: Effort normal and breath sounds normal. No respiratory distress. He exhibits no tenderness.  Abdominal: Soft. Normal appearance and bowel sounds are normal. There is no hepatosplenomegaly. There is no tenderness. There is no rebound, no guarding, no tenderness at McBurney's point and negative Murphy's sign. No hernia.  Musculoskeletal: Normal range of motion.  Neurological: He is alert and oriented to person, place, and time. He has normal strength. No cranial nerve deficit or sensory deficit. Coordination normal. GCS eye subscore is 4. GCS  verbal subscore is 5. GCS motor subscore is 6.  Extraocular muscle movement: normal No visual field cut Pupils: equal and reactive both direct and consensual response is normal No nystagmus present    Subjective decreased sensation right arm Proprioception intact  Grip strength 5/5 symmetric in upper extremities No pronator drift Normal finger to nose bilaterally  Lower extremity strength 5/5 against gravity Normal heel to shin  bilaterally  Gait: normal Rhomberg: Normal  Skin: Skin is warm, dry and intact. No rash noted. No cyanosis.  Psychiatric: He has a normal mood and affect. His speech is normal and behavior is normal. Thought content normal.  Nursing note and vitals reviewed.   ED Course  Procedures (including critical care time) Labs Review Labs Reviewed  CBC - Abnormal; Notable for the following:    WBC 10.7 (*)    Hemoglobin 12.8 (*)    HCT 38.6 (*)    All other components within normal limits  DIFFERENTIAL - Abnormal; Notable for the following:    Neutro Abs 7.9 (*)    All other components within normal limits  COMPREHENSIVE METABOLIC PANEL - Abnormal; Notable for the following:    Glucose, Bld 267 (*)    BUN 24 (*)    Creatinine, Ser 1.96 (*)    GFR calc non Af Amer 34 (*)    GFR calc Af Amer 39 (*)    All other components within normal limits  CBG MONITORING, ED - Abnormal; Notable for the following:    Glucose-Capillary 238 (*)    All other components within normal limits  PROTIME-INR  APTT  I-STAT TROPOININ, ED    Imaging Review Ct Head (brain) Wo Contrast  12/05/2014   CLINICAL DATA:  64 year old male with stroke symptoms. Right side numbness since yesterday. Initial encounter.  EXAM: CT HEAD WITHOUT CONTRAST  TECHNIQUE: Contiguous axial images were obtained from the base of the skull through the vertex without intravenous contrast.  COMPARISON:  None.  FINDINGS: Visualized paranasal sinuses and mastoids are clear. No acute osseous abnormality identified. Visualized orbits and scalp soft tissues are within normal limits. Calcified atherosclerosis at the skull base.  No midline shift, mass effect, or evidence of intracranial mass lesion. No ventriculomegaly. No evidence of cortically based acute infarction identified. Normal for age gray-white matter differentiation. No suspicious intracranial vascular hyperdensity.  IMPRESSION: Normal for age non contrast CT appearance of the brain.    Electronically Signed   By: Genevie Ann M.D.   On: 12/05/2014 13:09     EKG Interpretation   Date/Time:  Wednesday December 05 2014 11:26:23 EDT Ventricular Rate:  67 PR Interval:  210 QRS Duration: 86 QT Interval:  432 QTC Calculation: 456 R Axis:   -12 Text Interpretation:  Sinus rhythm with 1st degree A-V block Otherwise  normal ECG No previous tracing Confirmed by POLLINA  MD, CHRISTOPHER  [phone]) on 12/05/2014 11:49:58 AM      MDM   Final diagnoses:  None  right side weakness  Patient with past medical history of hypertension, high cholesterol, diabetes presents to the ER for evaluation of right-sided numbness, tingling and weakness of the right upper extremity since yesterday. He has continued symptomatic sensory deficit in the right upper extremity, but I do not appreciate any clear motor deficit on examination. Initial workup including CT was unremarkable. Cannot rule out CVA based on CT. Will have neurology consultation, admission for further workup.    Orpah Greek, MD 12/05/14 1319

## 2014-12-05 NOTE — ED Notes (Signed)
MD Pollina at bedside. 

## 2014-12-05 NOTE — Progress Notes (Addendum)
Assessed patient and noted was new onset of facial drooping to left side, left arm is weaker than right, and ataxia in both limbs.Last known well time was 1915 with right arm numbness and tingling. Patient BP is 195/91. Notified Rapid Response and [facility] for Code Stroke. Also sent message to Schorr NP and notified Charge Nurse. Will continue to monitor closely.

## 2014-12-05 NOTE — ED Notes (Signed)
Neurology at bedside.

## 2014-12-05 NOTE — Progress Notes (Addendum)
Patient is going off the floor to CT  2230, Patient back from [place], Notified Schorr NP, that patient's BP is still elevated and could he received orders. Will continue to monitor accordingly.

## 2014-12-05 NOTE — ED Notes (Signed)
CBG = 238  RN Elmyra Ricks informed of result.

## 2014-12-06 ENCOUNTER — Other Ambulatory Visit: Payer: Self-pay | Admitting: Neurology

## 2014-12-06 DIAGNOSIS — I6789 Other cerebrovascular disease: Secondary | ICD-10-CM | POA: Diagnosis not present

## 2014-12-06 DIAGNOSIS — E785 Hyperlipidemia, unspecified: Secondary | ICD-10-CM | POA: Diagnosis not present

## 2014-12-06 DIAGNOSIS — I10 Essential (primary) hypertension: Secondary | ICD-10-CM | POA: Diagnosis not present

## 2014-12-06 DIAGNOSIS — I63312 Cerebral infarction due to thrombosis of left middle cerebral artery: Secondary | ICD-10-CM

## 2014-12-06 DIAGNOSIS — R05 Cough: Secondary | ICD-10-CM | POA: Diagnosis not present

## 2014-12-06 DIAGNOSIS — E1159 Type 2 diabetes mellitus with other circulatory complications: Secondary | ICD-10-CM | POA: Diagnosis not present

## 2014-12-06 DIAGNOSIS — I63332 Cerebral infarction due to thrombosis of left posterior cerebral artery: Secondary | ICD-10-CM

## 2014-12-06 LAB — BASIC METABOLIC PANEL
Anion gap: 13 (ref 5–15)
BUN: 22 mg/dL (ref 6–23)
CO2: 20 mmol/L (ref 19–32)
Calcium: 9.7 mg/dL (ref 8.4–10.5)
Chloride: 113 mmol/L — ABNORMAL HIGH (ref 96–112)
Creatinine, Ser: 1.77 mg/dL — ABNORMAL HIGH (ref 0.50–1.35)
GFR calc Af Amer: 44 mL/min — ABNORMAL LOW (ref >90)
GFR, EST NON AFRICAN AMERICAN: 38 mL/min — AB (ref >90)
Glucose, Bld: 169 mg/dL — ABNORMAL HIGH (ref 70–99)
Potassium: 3.7 mmol/L (ref 3.5–5.1)
SODIUM: 146 mmol/L — AB (ref 135–145)

## 2014-12-06 LAB — CBC
HCT: 38.3 % — ABNORMAL LOW (ref 39.0–52.0)
HEMOGLOBIN: 12.8 g/dL — AB (ref 13.0–17.0)
MCH: 30.4 pg (ref 26.0–34.0)
MCHC: 33.4 g/dL (ref 30.0–36.0)
MCV: 91 fL (ref 78.0–100.0)
PLATELETS: 204 10*3/uL (ref 150–400)
RBC: 4.21 MIL/uL — AB (ref 4.22–5.81)
RDW: 13.4 % (ref 11.5–15.5)
WBC: 10.3 10*3/uL (ref 4.0–10.5)

## 2014-12-06 LAB — HEMOGLOBIN A1C
Hgb A1c MFr Bld: 6.6 % — ABNORMAL HIGH (ref 4.8–5.6)
Mean Plasma Glucose: 143 mg/dL

## 2014-12-06 LAB — IRON AND TIBC
Iron: 76 ug/dL (ref 42–165)
Saturation Ratios: 28 % (ref 20–55)
TIBC: 268 ug/dL (ref 215–435)
UIBC: 192 ug/dL (ref 125–400)

## 2014-12-06 LAB — GLUCOSE, CAPILLARY
GLUCOSE-CAPILLARY: 194 mg/dL — AB (ref 70–99)
Glucose-Capillary: 303 mg/dL — ABNORMAL HIGH (ref 70–99)

## 2014-12-06 LAB — LIPID PANEL
Cholesterol: 154 mg/dL (ref 0–200)
HDL: 31 mg/dL — ABNORMAL LOW (ref >39)
LDL Cholesterol: 65 mg/dL (ref 0–99)
Total CHOL/HDL Ratio: 5 RATIO
Triglycerides: 291 mg/dL — ABNORMAL HIGH (ref <150)
VLDL: 58 mg/dL — ABNORMAL HIGH (ref 0–40)

## 2014-12-06 LAB — OSMOLALITY: Osmolality: 310 mOsm/kg — ABNORMAL HIGH (ref 275–300)

## 2014-12-06 LAB — VITAMIN B12: VITAMIN B 12: 1330 pg/mL — AB (ref 211–911)

## 2014-12-06 LAB — FERRITIN: FERRITIN: 225 ng/mL (ref 22–322)

## 2014-12-06 LAB — FOLATE: Folate: >20 ng/mL

## 2014-12-06 MED ORDER — ASPIRIN 81 MG PO CHEW: 81.0000 mg | CHEWABLE_TABLET | Freq: Every day | ORAL | Status: DC

## 2014-12-06 MED ORDER — CLOPIDOGREL BISULFATE 75 MG PO TABS: 75.0000 mg | ORAL_TABLET | Freq: Every day | ORAL | Status: DC

## 2014-12-06 MED ORDER — ASPIRIN 81 MG PO CHEW: 324.0000 mg | CHEWABLE_TABLET | Freq: Every day | ORAL | Status: DC

## 2014-12-06 MED ORDER — CLOPIDOGREL BISULFATE 75 MG PO TABS
75.0000 mg | ORAL_TABLET | Freq: Every day | ORAL | Status: DC
  Administered 2014-12-06: 75 mg via ORAL
  Filled 2014-12-06: qty 1

## 2014-12-06 MED ORDER — POLYETHYLENE GLYCOL 3350 17 G PO PACK: 17.0000 g | PACK | Freq: Every day | ORAL | Status: DC | PRN

## 2014-12-06 MED ORDER — GLIPIZIDE 5 MG PO TABS: 2.5000 mg | ORAL_TABLET | Freq: Two times a day (BID) | ORAL | Status: AC

## 2014-12-06 MED ORDER — CLOPIDOGREL BISULFATE 75 MG PO TABS: 75.0000 mg | ORAL_TABLET | Freq: Every day | ORAL | Status: AC

## 2014-12-06 MED ORDER — STROKE: EARLY STAGES OF RECOVERY BOOK
Freq: Once | Status: AC
  Administered 2014-12-06: 1

## 2014-12-06 NOTE — Progress Notes (Signed)
  Echocardiogram 2D Echocardiogram has been performed.  Donata Clay 12/06/2014, 9:53 AM

## 2014-12-06 NOTE — Progress Notes (Signed)
Inpatient Diabetes Program Recommendations  AACE/ADA: New Consensus Statement on Inpatient Glycemic Control (2013)  Target Ranges:  Prepandial:   less than 140 mg/dL      Peak postprandial:   less than 180 mg/dL (1-2 hours)      Critically ill patients:  140 - 180 mg/dL   Results for David Glass, David Glass (MRN [id number]) as of 12/06/2014 11:55  Ref. Range 12/05/2014 19:10 12/06/2014 05:20 12/06/2014 05:25  Creatinine Latest Range: 0.50-1.35 mg/dL 1.84 (H)  1.77 (H)   Results for David Glass, David Glass (MRN [id number]) as of 12/06/2014 11:55  Ref. Range 12/05/2014 12:50 12/05/2014 21:15 12/06/2014 06:54  Glucose-Capillary Latest Range: 70-99 mg/dL 238 (H) 143 (H) 194 (H)   Reason for assessment: elevated CBG- noon CBG 303mg /dl  Diabetes history: Type 2 - A1C 6.6% Outpatient Diabetes medications: Glucophage 1000mg  bid Current orders for Inpatient glycemic control: Novolog sensitive correction scale 0-9 tid with meals, 0-5 units qhs  CBG elevated this admission.  Please consider starting Lantus 10 units qhs (107kg x 0.1=10), agree with current Novolog correction given his diagnosis of kidney disease. Creatinine this admission is elevated- please consider NOT reordering Metformin at discharge.  Gentry Fitz, RN, BA, MHA, CDE Diabetes Coordinator Inpatient Diabetes Program  [phone] (Team Pager) [phone] [facility]) 12/06/2014 12:23 PM

## 2014-12-06 NOTE — Progress Notes (Signed)
Pt discharged with his wife at this time alert, verbal taking all personal belongings. IV discontinued, applied dry dressing. Discharge instructions provided with verbal understanding pt and wife aware of scheduling of follow-up appts. Pt stated that he does not need any therapy services or equipment for home. No noted distress. He denied pain or discomfort.

## 2014-12-06 NOTE — Progress Notes (Signed)
UR completed 

## 2014-12-06 NOTE — Progress Notes (Signed)
STROKE TEAM PROGRESS NOTE   HISTORY David Glass is an 64 y.o. male presenting to ED 12/05/2014 after he noted he had a "tingling sensation in this right arm and leg yesterday 12/04/2014, time unknown. He noted that the sensation increased as the day went on. He stated his right hand was noticeably different last night. Last night he noted his hand was not as strong and he was clumsy with his right hand. This AM he awoke and noted both arm and leg had more of a sensory change. He went to his PCP who sent him to the ER. He has had similar symptoms in bilateral arms about a year ago. Symptoms at that time lasted for about one hour. He never went to a MD about this. He is on ASA and takes it daily. Patient was not administered TPA secondary to delay in  arrival. He was admitted for further evaluation and treatment.  Neuro worsening over night with Code Stroke called. New L facial weakness and L sided dysmetria. NIHSS 1->5 per RN. Exam improved (same as in ED earlier) upon MD  Assessment. MRI positive for L thalamic infarct. Not a candidate for treatment/intervention. Stat CT confirmed evolving L thalamic infarct, no other change.   SUBJECTIVE (INTERVAL HISTORY)   No family is at the bedside.  Overall he feels his condition is stable. He reports a similar episode in the past, but is unable to remember details.   OBJECTIVE Temp:  [97.9 F (36.6 C)-98.9 F (37.2 C)] 98.4 F (36.9 C) (03/17 0547) Pulse Rate:  [60-68] 66 (03/17 0547) Cardiac Rhythm:  [-]  Resp:  [14-20] 18 (03/17 0547) BP: (139-200)/(58-99) 145/58 mmHg (03/17 0547) SpO2:  [94 %-100 %] 97 % (03/17 0547) Weight:  [107.049 kg (236 lb)] 107.049 kg (236 lb) (03/16 1128)   Recent Labs Lab 12/05/14 1250 12/05/14 2115 12/06/14 0654  GLUCAP 238* 143* 194*    Recent Labs Lab 12/05/14 1140 12/05/14 1910 12/06/14 0525  NA 141  --  146*  K 4.2  --  3.7  CL 109  --  113*  CO2 25  --  20  GLUCOSE 267*  --  169*  BUN 24*  --   22  CREATININE 1.96* 1.84* 1.77*  CALCIUM 9.9  --  9.7    Recent Labs Lab 12/05/14 1140  AST 20  ALT 25  ALKPHOS 81  BILITOT 0.7  PROT 6.7  ALBUMIN 4.0    Recent Labs Lab 12/05/14 1140 12/05/14 1910 12/06/14 0525  WBC 10.7* 9.9 10.3  NEUTROABS 7.9*  --   --   HGB 12.8* 12.9* 12.8*  HCT 38.6* 39.0 38.3*  MCV 90.4 90.5 91.0  PLT 212 209 204   No results for input(s): CKTOTAL, CKMB, CKMBINDEX, TROPONINI in the last 168 hours.  Recent Labs  12/05/14 1140  LABPROT 13.2  INR 0.99    Recent Labs  12/05/14 1540  COLORURINE YELLOW  LABSPEC 1.006  PHURINE 6.5  GLUCOSEU NEGATIVE  HGBUR NEGATIVE  BILIRUBINUR NEGATIVE  KETONESUR NEGATIVE  PROTEINUR NEGATIVE  UROBILINOGEN 0.2  NITRITE NEGATIVE  LEUKOCYTESUR NEGATIVE       Component Value Date/Time   CHOL 154 12/06/2014 0520   TRIG 291* 12/06/2014 0520   HDL 31* 12/06/2014 0520   CHOLHDL 5.0 12/06/2014 0520   VLDL 58* 12/06/2014 0520   LDLCALC 65 12/06/2014 0520   Lab Results  Component Value Date   HGBA1C 6.6* 12/05/2014   No results found for: LABOPIA, COCAINSCRNUR, LABBENZ,  AMPHETMU, THCU, LABBARB  No results for input(s): ETH in the last 168 hours.  I have personally reviewed the radiological images below and agree with the radiology interpretations.  Ct Head Wo Contrast  12/05/2014     IMPRESSION: Evolving LEFT thalamus lacunar infarct better characterized on MRI of the brain from same day, reported separately.  Moderate white matter changes suggest chronic small vessel ischemic disease. Involutional changes.    12/05/2014   IMPRESSION: Normal for age non contrast CT appearance of the brain.     Mr Brain Wo Contrast  12/05/2014    IMPRESSION: Small acute infarct left thalamus.  Mild chronic microvascular ischemic change in the white matter  Significant vertebrobasilar disease.   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 There is atherosclerotic disease in the basilar and posterior cerebral arteries bilaterally.     Dg Chest Port 1 View  12/05/2014   IMPRESSION: No acute abnormality.      PHYSICAL EXAM  Temp:  [97.9 F (36.6 C)-98.4 F (36.9 C)] 98.1 F (36.7 C) (03/17 1327) Pulse Rate:  [60-71] 71 (03/17 1327) Resp:  [14-20] 19 (03/17 1327) BP: (145-200)/(58-99) 164/80 mmHg (03/17 1327) SpO2:  [96 %-100 %] 98 % (03/17 1327)  General - Well nourished, well developed, in no apparent distress.  Ophthalmologic - Sharp disc margins OU.  Cardiovascular - Regular rate and rhythm with no murmur.  Mental Status -  Level of arousal and orientation to time, place, and person were intact. Language including expression, naming, repetition, comprehension, reading, and writing was assessed and found intact.  Cranial Nerves II - XII - II - Visual field intact OU. III, IV, VI - Extraocular movements intact. V - Facial sensation mildly decreased on the right. VII - Facial movement intact bilaterally. VIII - Hearing & vestibular intact bilaterally. X - Palate elevates symmetrically. XI - Chin turning & shoulder shrug intact bilaterally. XII - Tongue protrusion intact.  Motor Strength - The patient's strength was normal in all extremities and pronator drift was absent.  Bulk was normal and fasciculations were absent.   Motor Tone - Muscle tone was assessed at the neck and appendages and was normal.  Reflexes - The patient's reflexes were normal in all extremities and he had no pathological reflexes.  Sensory - Light touch, temperature/pinprick mildly decreased on the right upper extremity.    Coordination - The patient had normal movements in the hands and feet with no ataxia or dysmetria.  Tremor was absent.  Gait and Station - The patient's transfers, posture, gait, station, and turns were observed as normal.   ASSESSMENT/PLAN Mr. David Glass is a 64 y.o.  male with history of hypertension, diabetes, hyperlipidemia, and CKD presenting with right sided tingling without weakness. He did not receive IV t-PA due to delay in arrival.   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 Given large vessel intracranial atherosclerosis (  b/l PCA athero and R VA occlusion), we recommend dural antiplatelet with aspirin 81 mg and clopidogrel 75 mg orally every day x 3 months for secondary stroke prevention. After 3 months, change to plavix alone. Long-term dual antiplatelets are contraindicated due to risk for intracerebral hemorrhage. Order written.  Ongoing aggressive stroke risk factor management  Therapy recommendations:  pending   Disposition:  Anticipate return home  Hypertension, accelerated BP 139-200/66-86 past 24h (12/06/2014 @ 10:23 AM) Permissive hypertension (OK if <220/120) for 24-48 hours post stroke and then gradually normalized within 5-7 days. Recommend to gradually increase BP meds Blood pressure is still high, close monitoring   Hyperlipidemia  Home meds:  pravachol 20, resumed in hospital  LDL 65, at goal < 70  Elevated triglycerides 291  Continue statin at discharge  Diabetes, type 2  Home meds metformin  HgbA1c 6.6, at goal < 7.0  DM education  SSI  Management per primary team  Other Stroke Risk Factors  Advanced age  Occasional etoh use  Other Active Problems  Anemia, 12.8  Mild leukocytosis 10.7->10.3  CKD stage 3 due to type 2 diabetes mellitus  B12 deficiency  Peripheral neuropathy   Hospital  day #   [place] for Pager information 12/06/2014 10:26 AM   I, the attending vascular neurologist, have personally obtained a history, examined the patient, evaluated laboratory data, individually viewed imaging studies and agree with radiology interpretations. I also discussed with Dr. Allyson Sabal regarding his care plan. Together with the NP/PA, we formulated the assessment and plan of care which reflects our mutual decision.  I have made any additions or clarifications directly to the above note and agree with the findings and plan as currently documented.   64 year old male with history of hypertension, diabetes, hyperlipidemia presented with right-sided numbness. MRI showed left thalamic stroke, consistent with small vessel disease. MRA showed bilateral PCA atherosclerosis and right BA occlusion. Recommend dural antiplatelet for 3 months and then Plavix alone. Continue statin. Pending carotid Doppler and 2-D echo. PT/OT  Rosalin Hawking, MD PhD Stroke Neurology 12/06/2014 1:51 PM     To contact Stroke Continuity provider, please refer to [email or web address] After hours, contact General Neurology

## 2014-12-06 NOTE — Discharge Summary (Addendum)
Physician Discharge Summary  David Glass MRN: [id number] DOB/AGE: 1950/10/30 64 y.o.  PCP: David Bellows, MD   Admit date: 12/05/2014 Discharge date: 12/06/2014  Discharge Diagnoses:     Principal Problem:   TIA (transient ischemic attack) Active Problems:   CKD stage 3 due to type 2 diabetes mellitus   Right sided weakness   Neuropathy, peripheral   B12 deficiency   Follow-up recommendations Follow-up with PCP in 5-7 days dual antiplatelet with aspirin 81 mg and clopidogrel 75 mg orally every day x 3 months for secondary stroke prevention. After 3 months, change to plavix alone    Medication List    TAKE these medications        aspirin 81 MG tablet  Take 81 mg by mouth daily.     clopidogrel 75 MG tablet  Commonly known as:  PLAVIX  Take 1 tablet (75 mg total) by mouth daily.     lisinopril 10 MG tablet  Commonly known as:  PRINIVIL,ZESTRIL  Take 10 mg by mouth daily.     lithium carbonate 300 MG capsule  Take 300 mg by mouth 2 (two) times daily. Take 600 mg in the morning and 300 mg at bedtime      glipizide 2.5 mg by mouth twice a day          polyethylene glycol packet  Commonly known as:  MIRALAX / GLYCOLAX  Take 17 g by mouth daily as needed for mild constipation.     pravastatin 20 MG tablet  Commonly known as:  PRAVACHOL  Take 20 mg by mouth daily.     propranolol 80 MG tablet  Commonly known as:  INDERAL  Take 80 mg by mouth 2 (two) times daily.     VITAMIN B 12 PO  Take 1 tablet by mouth daily.        Discharge Condition:Stable sition: Final discharge disposition not confirmed   Consults: Neurology    Ct Head Wo Contrast  12/05/2014   CLINICAL DATA:  LEFT-sided facial droop, LEFT-sided ataxia, acute symptoms. History of renal failure, diabetes.  EXAM: CT HEAD WITHOUT CONTRAST  TECHNIQUE: Contiguous axial images were obtained from the base of the skull through the vertex without intravenous contrast.  COMPARISON:  MRI of the brain  December 05, 2014 at 1823 hours  FINDINGS: The ventricles and sulci are normal for age. No intraparenchymal hemorrhage, mass effect nor midline shift. Patchy supratentorial white matter hypodensities are mildly advanced for patient's age and though non-specific suggest sequelae of chronic small vessel ischemic disease. No acute large vascular territory infarcts. Subcentimeter hypodensity in LEFT thalamus corresponding to known stroke.  No abnormal extra-axial fluid collections. Basal cisterns are patent. Mild calcific atherosclerosis of the carotid siphons.  No skull fracture. The included ocular globes and orbital contents are non-suspicious. The mastoid aircells and included paranasal sinuses are well-aerated.  IMPRESSION: Evolving LEFT thalamus lacunar infarct better characterized on MRI of the brain from same day, reported separately.  Moderate white matter changes suggest chronic small vessel ischemic disease. Involutional changes.  Acute findings discussed with and reconfirmed by Dr.LESLIE REYNOLDS on 12/05/2014 at 10:34 pm.   Electronically Signed   By: Elon Alas   On: 12/05/2014 22:35   Ct Head (brain) Wo Contrast  12/05/2014   CLINICAL DATA:  64 year old male with stroke symptoms. Right side numbness since yesterday. Initial encounter.  EXAM: CT HEAD WITHOUT CONTRAST  TECHNIQUE: Contiguous axial images were obtained from the base of the  skull through the vertex without intravenous contrast.  COMPARISON:  None.  FINDINGS: Visualized paranasal sinuses and mastoids are clear. No acute osseous abnormality identified. Visualized orbits and scalp soft tissues are within normal limits. Calcified atherosclerosis at the skull base.  No midline shift, mass effect, or evidence of intracranial mass lesion. No ventriculomegaly. No evidence of cortically based acute infarction identified. Normal for age gray-white matter differentiation. No suspicious intracranial vascular hyperdensity.  IMPRESSION: Normal for age  non contrast CT appearance of the brain.   Electronically Signed   By: Genevie Ann M.D.   On: 12/05/2014 13:09   Mr David Glass Head Wo Contrast  12/05/2014   CLINICAL DATA:  Tingling sensation right arm and leg  EXAM: MRI HEAD WITHOUT CONTRAST  MRA HEAD WITHOUT CONTRAST  TECHNIQUE: Multiplanar, multiecho pulse sequences of the brain and surrounding structures were obtained without intravenous contrast. Angiographic images of the head were obtained using MRA technique without contrast.  COMPARISON:  CT head 12/05/2014  FINDINGS: MRI HEAD FINDINGS  Acute infarct left thalamus measuring approximately 6 mm. This is located in the central thalamus. No other acute infarct.  Mild atrophy. Mild chronic microvascular ischemic change in the white matter. Brainstem and cerebellum intact  Negative for intracranial hemorrhage.  Negative for mass or edema  Paranasal sinuses are clear.  MRA HEAD FINDINGS  Occlusion distal right vertebral artery at the level the dura. Occlusion is at the level of PICA. This appears to be an abrupt occlusion and could be a cause of emboli. This could be due to atherosclerotic disease or dissection. Left vertebral artery widely patent. AICA patent bilaterally. Basilar shows mild atherosclerotic disease in the midportion. Fetal origin of the posterior cerebral artery bilaterally with hypoplastic distal basilar. Superior cerebellar arteries are patent bilaterally. Atherosclerotic irregularity and moderate stenosis in the posterior cerebral artery bilaterally  Internal carotid artery widely patent bilaterally. Anterior and middle cerebral arteries widely patent without significant stenosis  Negative for cerebral aneurysm  IMPRESSION: Small acute infarct left thalamus.  Mild chronic microvascular ischemic change in the white matter  Significant vertebrobasilar disease. Abrupt occlusion distal right vertebral artery at the level of PICA which may be due to dissection or thrombosis. There is atherosclerotic  disease in the basilar and posterior cerebral arteries bilaterally.   Electronically Signed   By: Franchot Gallo M.D.   On: 12/05/2014 19:16   Mr Brain Wo Contrast  12/05/2014   CLINICAL DATA:  Tingling sensation right arm and leg  EXAM: MRI HEAD WITHOUT CONTRAST  MRA HEAD WITHOUT CONTRAST  TECHNIQUE: Multiplanar, multiecho pulse sequences of the brain and surrounding structures were obtained without intravenous contrast. Angiographic images of the head were obtained using MRA technique without contrast.  COMPARISON:  CT head 12/05/2014  FINDINGS: MRI HEAD FINDINGS  Acute infarct left thalamus measuring approximately 6 mm. This is located in the central thalamus. No other acute infarct.  Mild atrophy. Mild chronic microvascular ischemic change in the white matter. Brainstem and cerebellum intact  Negative for intracranial hemorrhage.  Negative for mass or edema  Paranasal sinuses are clear.  MRA HEAD FINDINGS  Occlusion distal right vertebral artery at the level the dura. Occlusion is at the level of PICA. This appears to be an abrupt occlusion and could be a cause of emboli. This could be due to atherosclerotic disease or dissection. Left vertebral artery widely patent. AICA patent bilaterally. Basilar shows mild atherosclerotic disease in the midportion. Fetal origin of the posterior cerebral artery bilaterally with hypoplastic  distal basilar. Superior cerebellar arteries are patent bilaterally. Atherosclerotic irregularity and moderate stenosis in the posterior cerebral artery bilaterally  Internal carotid artery widely patent bilaterally. Anterior and middle cerebral arteries widely patent without significant stenosis  Negative for cerebral aneurysm  IMPRESSION: Small acute infarct left thalamus.  Mild chronic microvascular ischemic change in the white matter  Significant vertebrobasilar disease. Abrupt occlusion distal right vertebral artery at the level of PICA which may be due to dissection or thrombosis.  There is atherosclerotic disease in the basilar and posterior cerebral arteries bilaterally.   Electronically Signed   By: Franchot Gallo M.D.   On: 12/05/2014 19:16   Dg Chest Port 1 View  12/05/2014   CLINICAL DATA:  Right hand tingling and numbness today. Chronic dry cough.  EXAM: PORTABLE CHEST - 1 VIEW  COMPARISON:  None.  FINDINGS: Borderline enlarged cardiac silhouette. Clear lungs with normal vascularity. Thoracic spine degenerative changes and mild scoliosis.  IMPRESSION: No acute abnormality.   Electronically Signed   By: Claudie Revering M.D.   On: 12/05/2014 14:15      Microbiology: No results found for this or any previous visit (from the past 240 hour(s)).   Labs: Results for orders placed or performed during the hospital encounter of 12/05/14 (from the past 48 hour(s))  Protime-INR     Status: None   Collection Time: 12/05/14 11:40 AM  Result Value Ref Range   Prothrombin Time 13.2 11.6 - 15.2 seconds   INR 0.99 0.00 - 1.49  APTT     Status: None   Collection Time: 12/05/14 11:40 AM  Result Value Ref Range   aPTT 30 24 - 37 seconds  CBC     Status: Abnormal   Collection Time: 12/05/14 11:40 AM  Result Value Ref Range   WBC 10.7 (H) 4.0 - 10.5 K/uL   RBC 4.27 4.22 - 5.81 MIL/uL   Hemoglobin 12.8 (L) 13.0 - 17.0 g/dL   HCT 38.6 (L) 39.0 - 52.0 %   MCV 90.4 78.0 - 100.0 fL   MCH 30.0 26.0 - 34.0 pg   MCHC 33.2 30.0 - 36.0 g/dL   RDW 13.5 11.5 - 15.5 %   Platelets 212 150 - 400 K/uL  Differential     Status: Abnormal   Collection Time: 12/05/14 11:40 AM  Result Value Ref Range   Neutrophils Relative % 74 43 - 77 %   Neutro Abs 7.9 (H) 1.7 - 7.7 K/uL   Lymphocytes Relative 18 12 - 46 %   Lymphs Abs 1.9 0.7 - 4.0 K/uL   Monocytes Relative 5 3 - 12 %   Monocytes Absolute 0.5 0.1 - 1.0 K/uL   Eosinophils Relative 3 0 - 5 %   Eosinophils Absolute 0.4 0.0 - 0.7 K/uL   Basophils Relative 0 0 - 1 %   Basophils Absolute 0.0 0.0 - 0.1 K/uL  Comprehensive metabolic panel      Status: Abnormal   Collection Time: 12/05/14 11:40 AM  Result Value Ref Range   Sodium 141 135 - 145 mmol/L   Potassium 4.2 3.5 - 5.1 mmol/L   Chloride 109 96 - 112 mmol/L   CO2 25 19 - 32 mmol/L   Glucose, Bld 267 (H) 70 - 99 mg/dL   BUN 24 (H) 6 - 23 mg/dL   Creatinine, Ser 1.96 (H) 0.50 - 1.35 mg/dL   Calcium 9.9 8.4 - 10.5 mg/dL   Total Protein 6.7 6.0 - 8.3 g/dL   Albumin 4.0 3.5 -  5.2 g/dL   AST 20 0 - 37 U/L   ALT 25 0 - 53 U/L   Alkaline Phosphatase 81 39 - 117 U/L   Total Bilirubin 0.7 0.3 - 1.2 mg/dL   GFR calc non Af Amer 34 (L) >90 mL/min   GFR calc Af Amer 39 (L) >90 mL/min    Comment: (NOTE) The eGFR has been calculated using the CKD EPI equation. This calculation has not been validated in all clinical situations. eGFR's persistently <90 mL/min signify possible Chronic Kidney Disease.    Anion gap 7 5 - 15  I-stat troponin, ED (not at [facility])     Status: None   Collection Time: 12/05/14 11:51 AM  Result Value Ref Range   Troponin i, poc 0.00 0.00 - 0.08 ng/mL   Comment 3            Comment: Due to the release kinetics of cTnI, a negative result within the first hours of the onset of symptoms does not rule out myocardial infarction with certainty. If myocardial infarction is still suspected, repeat the test at appropriate intervals.   CBG monitoring, ED     Status: Abnormal   Collection Time: 12/05/14 12:50 PM  Result Value Ref Range   Glucose-Capillary 238 (H) 70 - 99 mg/dL  Osmolality     Status: Abnormal   Collection Time: 12/05/14  2:40 PM  Result Value Ref Range   Osmolality 310 (H) 275 - 300 mOsm/kg    Comment: Performed at [facility]  Hemoglobin A1c     Status: Abnormal   Collection Time: 12/05/14  2:40 PM  Result Value Ref Range   Hgb A1c MFr Bld 6.6 (H) 4.8 - 5.6 %    Comment: (NOTE)         Pre-diabetes: 5.7 - 6.4         Diabetes: >6.4         Glycemic control for adults with diabetes: <7.0    Mean Plasma Glucose 143 mg/dL     Comment: (NOTE) Performed At: [facility] [place], [place] [id number] Lindon Romp MD JT:[id number]   Urinalysis, Routine w reflex microscopic     Status: None   Collection Time: 12/05/14  3:40 PM  Result Value Ref Range   Color, Urine YELLOW YELLOW   APPearance CLEAR CLEAR   Specific Gravity, Urine 1.006 1.005 - 1.030   pH 6.5 5.0 - 8.0   Glucose, UA NEGATIVE NEGATIVE mg/dL   Hgb urine dipstick NEGATIVE NEGATIVE   Bilirubin Urine NEGATIVE NEGATIVE   Ketones, ur NEGATIVE NEGATIVE mg/dL   Protein, ur NEGATIVE NEGATIVE mg/dL   Urobilinogen, UA 0.2 0.0 - 1.0 mg/dL   Nitrite NEGATIVE NEGATIVE   Leukocytes, UA NEGATIVE NEGATIVE    Comment: MICROSCOPIC NOT DONE ON URINES WITH NEGATIVE PROTEIN, BLOOD, LEUKOCYTES, NITRITE, OR GLUCOSE <1000 mg/dL.  Creatinine, urine, random     Status: None   Collection Time: 12/05/14  3:54 PM  Result Value Ref Range   Creatinine, Urine 29.73 mg/dL  Sodium, urine, random     Status: None   Collection Time: 12/05/14  3:54 PM  Result Value Ref Range   Sodium, Ur 42 mmol/L  Osmolality, urine     Status: Abnormal   Collection Time: 12/05/14  3:57 PM  Result Value Ref Range   Osmolality, Ur 213 (L) 390 - 1090 mOsm/kg    Comment: Performed at [facility]  TSH     Status: None  Collection Time: 12/05/14  7:10 PM  Result Value Ref Range   TSH 3.212 0.350 - 4.500 uIU/mL  Vitamin B12     Status: Abnormal   Collection Time: 12/05/14  7:10 PM  Result Value Ref Range   Vitamin B-12 1330 (H) 211 - 911 pg/mL    Comment: Performed at [facility]  Folate     Status: None   Collection Time: 12/05/14  7:10 PM  Result Value Ref Range   Folate >20.0 ng/mL    Comment: (NOTE) Reference Ranges        Deficient:       0.4 - 3.3 ng/mL        Indeterminate:   3.4 - 5.4 ng/mL        Normal:              > 5.4 ng/mL Performed at [facility]   Iron and TIBC     Status: None   Collection Time: 12/05/14  7:10 PM   Result Value Ref Range   Iron 76 42 - 165 ug/dL   TIBC 268 215 - 435 ug/dL   Saturation Ratios 28 20 - 55 %   UIBC 192 125 - 400 ug/dL    Comment: Performed at [facility]  Ferritin     Status: None   Collection Time: 12/05/14  7:10 PM  Result Value Ref Range   Ferritin 225 22 - 322 ng/mL    Comment: Performed at [facility]  Reticulocytes     Status: None   Collection Time: 12/05/14  7:10 PM  Result Value Ref Range   Retic Ct Pct 1.5 0.4 - 3.1 %   RBC. 4.31 4.22 - 5.81 MIL/uL   Retic Count, Manual 64.7 19.0 - 186.0 K/uL  CBC     Status: Abnormal   Collection Time: 12/05/14  7:10 PM  Result Value Ref Range   WBC 9.9 4.0 - 10.5 K/uL   RBC 4.31 4.22 - 5.81 MIL/uL   Hemoglobin 12.9 (L) 13.0 - 17.0 g/dL   HCT 39.0 39.0 - 52.0 %   MCV 90.5 78.0 - 100.0 fL   MCH 29.9 26.0 - 34.0 pg   MCHC 33.1 30.0 - 36.0 g/dL   RDW 13.4 11.5 - 15.5 %   Platelets 209 150 - 400 K/uL  Creatinine, serum     Status: Abnormal   Collection Time: 12/05/14  7:10 PM  Result Value Ref Range   Creatinine, Ser 1.84 (H) 0.50 - 1.35 mg/dL   GFR calc non Af Amer 37 (L) >90 mL/min   GFR calc Af Amer 42 (L) >90 mL/min    Comment: (NOTE) The eGFR has been calculated using the CKD EPI equation. This calculation has not been validated in all clinical situations. eGFR's persistently <90 mL/min signify possible Chronic Kidney Disease.   Glucose, capillary     Status: Abnormal   Collection Time: 12/05/14  9:15 PM  Result Value Ref Range   Glucose-Capillary 143 (H) 70 - 99 mg/dL  Lipid panel     Status: Abnormal   Collection Time: 12/06/14  5:20 AM  Result Value Ref Range   Cholesterol 154 0 - 200 mg/dL   Triglycerides 291 (H) <150 mg/dL   HDL 31 (L) >39 mg/dL   Total CHOL/HDL Ratio 5.0 RATIO   VLDL 58 (H) 0 - 40 mg/dL   LDL Cholesterol 65 0 - 99 mg/dL    Comment:  Total Cholesterol/HDL:CHD Risk Coronary Heart Disease Risk Table                     Men   Women  1/2 Average Risk    3.4   3.3  Average Risk       5.0   4.4  2 X Average Risk   9.6   7.1  3 X Average Risk  23.4   11.0        Use the calculated Patient Ratio above and the CHD Risk Table to determine the patient's CHD Risk.        ATP III CLASSIFICATION (LDL):  <100     mg/dL   Optimal  100-129  mg/dL   Near or Above                    Optimal  130-159  mg/dL   Borderline  160-189  mg/dL   High  >190     mg/dL   Very High   Basic metabolic panel     Status: Abnormal   Collection Time: 12/06/14  5:25 AM  Result Value Ref Range   Sodium 146 (H) 135 - 145 mmol/L   Potassium 3.7 3.5 - 5.1 mmol/L   Chloride 113 (H) 96 - 112 mmol/L   CO2 20 19 - 32 mmol/L   Glucose, Bld 169 (H) 70 - 99 mg/dL   BUN 22 6 - 23 mg/dL   Creatinine, Ser 1.77 (H) 0.50 - 1.35 mg/dL   Calcium 9.7 8.4 - 10.5 mg/dL   GFR calc non Af Amer 38 (L) >90 mL/min   GFR calc Af Amer 44 (L) >90 mL/min    Comment: (NOTE) The eGFR has been calculated using the CKD EPI equation. This calculation has not been validated in all clinical situations. eGFR's persistently <90 mL/min signify possible Chronic Kidney Disease.    Anion gap 13 5 - 15  CBC     Status: Abnormal   Collection Time: 12/06/14  5:25 AM  Result Value Ref Range   WBC 10.3 4.0 - 10.5 K/uL   RBC 4.21 (L) 4.22 - 5.81 MIL/uL   Hemoglobin 12.8 (L) 13.0 - 17.0 g/dL   HCT 38.3 (L) 39.0 - 52.0 %   MCV 91.0 78.0 - 100.0 fL   MCH 30.4 26.0 - 34.0 pg   MCHC 33.4 30.0 - 36.0 g/dL   RDW 13.4 11.5 - 15.5 %   Platelets 204 150 - 400 K/uL  Glucose, capillary     Status: Abnormal   Collection Time: 12/06/14  6:54 AM  Result Value Ref Range   Glucose-Capillary 194 (H) 70 - 99 mg/dL  Glucose, capillary     Status: Abnormal   Collection Time: 12/06/14 11:32 AM  Result Value Ref Range   Glucose-Capillary 303 (H) 70 - 99 mg/dL   Comment 1 Notify RN      HPI : David Glass is an 64 y.o. male presenting to ED 12/05/2014 after he noted he had a "tingling sensation in this right arm  and leg yesterday 12/04/2014, time unknown. He noted that the sensation increased as the day went on. He stated his right hand was noticeably different last night. Last night he noted his hand was not as strong and he was clumsy with his right hand. This AM he awoke and noted both arm and leg had more of a sensory change. He went to his PCP who sent him to the  ER. He has had similar symptoms in bilateral arms about a year ago. Symptoms at that time lasted for about one hour. He never went to a MD about this. He is on ASA and takes it daily. Patient was not administered TPA secondary to delay in arrival. He was admitted for further evaluation and treatment.  Neuro worsening over night with Code Stroke called. New L facial weakness and L sided dysmetria. NIHSS 1->5 per RN. Exam improved (same as in ED earlier) upon MD Assessment. MRI positive for L thalamic infarct. Not a candidate for treatment/intervention. Stat CT confirmed evolving L thalamic infarct, no other change.  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 After 3 months, change to plavix alone. Long-term dual antiplatelets are contraindicated due to risk for intracerebral hemorrhage. Order written.  Ongoing aggressive stroke  risk factor management  Therapy recommendations: pending   Disposition: Anticipate return home  Hypertension, accelerated  BP 139-200/66-86 past 24h (12/06/2014 @ 10:23 AM)  Permissive hypertension (OK if <220/120) for 24-48 hours post stroke and then gradually normalized within 5-7 days.  Resume lisinopril at discharge  Hyperlipidemia  Home meds: pravachol 20, resumed in hospital  LDL 65, at goal < 70  Elevated triglycerides 291  Continue statin at discharge  Diabetes, type 2  Home meds metformin  HgbA1c 6.6, at goal < 7.0  Metformin discontinued because of renal insufficiency, switched to glipizide 2.5 mg, twice a day     Other Stroke Risk Factors  Advanced age  Occasional etoh use  Other Active Problems  Anemia, 12.8  Mild leukocytosis 10.7->10.3  CKD stage 3 due to type 2 diabetes mellitus  B12 deficiency  Peripheral neuropathy    Discharge Exam:    Blood pressure 164/80, pulse 71, temperature 98.1 F (36.7 C), temperature source Oral, resp. rate 19, height _0  (1.905 m), weight 107.049 kg (236 lb), SpO2 98 %.   1. General middle-aged well-built white male lying in bed in NAD,   2. Normal affect and insight, Not Suicidal or Homicidal, Awake Alert, Oriented X 3.  3. No F.N deficits, ALL C.Nerves Intact, Strength 5/5 all 4 extremities, Sensation intact all 4 extremities, Plantars down going.  4. Ears and Eyes appear Normal, Conjunctivae clear, PERRLA. Moist Oral Mucosa.  5. Supple Neck, No JVD, No cervical lymphadenopathy appriciated, No Carotid Bruits.  6. Symmetrical Chest wall movement, Good air movement bilaterally, CTAB.       Discharge Instructions    Diet - low sodium heart healthy    Complete by:  As directed      Increase activity slowly    Complete by:  As directed            Follow-up Information    Follow up with WEBB, Valla Leaver, MD. Schedule an  appointment as soon as possible for a visit in 3 days.   Specialty:   Family Medicine   Contact information:   [address] [place] [id number] [phone]       Follow up with Xu,Jindong, MD. Schedule an appointment as soon as possible for a visit in 2 months.   Specialty:  Neurology   Why:  cva   Contact information:   [address] [place] [address] [phone]       Signed: Reyne Dumas 12/06/2014, 1:54 PM

## 2014-12-07 LAB — URINE CULTURE: Colony Count: 6000

## 2014-12-07 LAB — HEMOGLOBIN A1C
Hgb A1c MFr Bld: 6.8 % — ABNORMAL HIGH (ref 4.8–5.6)
MEAN PLASMA GLUCOSE: 148 mg/dL

## 2014-12-12 ENCOUNTER — Encounter (HOSPITAL_COMMUNITY): Payer: Self-pay | Admitting: Emergency Medicine

## 2014-12-12 ENCOUNTER — Emergency Department (HOSPITAL_COMMUNITY): Payer: Medicare Other

## 2014-12-12 ENCOUNTER — Emergency Department (HOSPITAL_COMMUNITY)
Admission: EM | Admit: 2014-12-12 | Discharge: 2014-12-12 | Disposition: A | Discharge status: Home / Self Care | Payer: Medicare Other | Attending: Emergency Medicine | Admitting: Emergency Medicine

## 2014-12-12 DIAGNOSIS — N183 Chronic kidney disease, stage 3 (moderate): Secondary | ICD-10-CM | POA: Insufficient documentation

## 2014-12-12 DIAGNOSIS — I129 Hypertensive chronic kidney disease with stage 1 through stage 4 chronic kidney disease, or unspecified chronic kidney disease: Secondary | ICD-10-CM | POA: Diagnosis not present

## 2014-12-12 DIAGNOSIS — Z8669 Personal history of other diseases of the nervous system and sense organs: Secondary | ICD-10-CM | POA: Diagnosis not present

## 2014-12-12 DIAGNOSIS — E538 Deficiency of other specified B group vitamins: Secondary | ICD-10-CM | POA: Diagnosis not present

## 2014-12-12 DIAGNOSIS — E1122 Type 2 diabetes mellitus with diabetic chronic kidney disease: Secondary | ICD-10-CM | POA: Diagnosis not present

## 2014-12-12 DIAGNOSIS — Z8673 Personal history of transient ischemic attack (TIA), and cerebral infarction without residual deficits: Secondary | ICD-10-CM | POA: Diagnosis not present

## 2014-12-12 DIAGNOSIS — Z7982 Long term (current) use of aspirin: Secondary | ICD-10-CM | POA: Diagnosis not present

## 2014-12-12 DIAGNOSIS — I639 Cerebral infarction, unspecified: Secondary | ICD-10-CM | POA: Diagnosis not present

## 2014-12-12 DIAGNOSIS — I638 Other cerebral infarction: Secondary | ICD-10-CM | POA: Insufficient documentation

## 2014-12-12 DIAGNOSIS — G467 Other lacunar syndromes: Secondary | ICD-10-CM | POA: Diagnosis not present

## 2014-12-12 DIAGNOSIS — Z79899 Other long term (current) drug therapy: Secondary | ICD-10-CM | POA: Insufficient documentation

## 2014-12-12 DIAGNOSIS — E780 Pure hypercholesterolemia: Secondary | ICD-10-CM | POA: Diagnosis not present

## 2014-12-12 DIAGNOSIS — I6381 Other cerebral infarction due to occlusion or stenosis of small artery: Secondary | ICD-10-CM

## 2014-12-12 DIAGNOSIS — R200 Anesthesia of skin: Secondary | ICD-10-CM | POA: Diagnosis present

## 2014-12-12 MED ORDER — LISINOPRIL 10 MG PO TABS
5.0000 mg | ORAL_TABLET | Freq: Once | ORAL | Status: AC
  Administered 2014-12-12: 5 mg via ORAL
  Filled 2014-12-12: qty 1

## 2014-12-12 MED ORDER — LISINOPRIL 10 MG PO TABS: 15.0000 mg | ORAL_TABLET | Freq: Two times a day (BID) | ORAL | Status: DC

## 2014-12-12 NOTE — ED Provider Notes (Signed)
CSN: [id number]     Arrival date & time 12/12/14  [id number] History   First MD Initiated Contact with Patient 12/12/14 [phone]     No chief complaint on file.  (Consider location/radiation/quality/duration/timing/severity/associated sxs/prior Treatment) HPI  David Glass is a 64 year old male presenting with new onset left-sided numbness. He states this left arm and leg numbness started about 10 AM yesterday. He states his symptoms wax and wane throughout the day and came back last night around 8 pm.  He also reports dropping things frequently in his left hand since yesterday.  He was was recently diagnosed and discharged from the hospital with a stroke and right sided weakness.  He reports adherence to all bp meds and blood thinners. He denies visual changes, headache, nausea, vomiting, difficulty walking or slurred speech.  Past Medical History  Diagnosis Date  . Hypertension   . High cholesterol   . Diabetes mellitus without complication   . CKD stage 3 due to type 2 diabetes mellitus 12/05/2014  . Neuropathy, peripheral 12/05/2014  . B12 deficiency 12/05/2014   History reviewed. No pertinent past surgical history. Family History  Problem Relation Age of Onset  . Hyperlipidemia Mother   . Hypertension Mother   . Hypertension Father   . Hyperlipidemia Father    History  Substance Use Topics  . Smoking status: Never Smoker   . Smokeless tobacco: Not on file  . Alcohol Use: No     Comment: occasional    Review of Systems  Constitutional: Negative for fever and chills.  HENT: Negative for sore throat.   Eyes: Negative for visual disturbance.  Respiratory: Negative for cough and shortness of breath.   Cardiovascular: Negative for chest pain and leg swelling.  Gastrointestinal: Negative for nausea, vomiting and diarrhea.  Genitourinary: Negative for dysuria.  Musculoskeletal: Negative for myalgias.  Skin: Negative for rash.  Neurological: Positive for numbness. Negative for facial  asymmetry, weakness and headaches.      Allergies  Alka-seltzer plus cold  Home Medications   Prior to Admission medications   Medication Sig Start Date End Date Taking? Authorizing Provider  aspirin 81 MG tablet Take 81 mg by mouth daily.    Historical Provider, MD  clopidogrel (PLAVIX) 75 MG tablet Take 1 tablet (75 mg total) by mouth daily. 12/06/14   Reyne Dumas, MD  Cyanocobalamin (VITAMIN B 12 PO) Take 1 tablet by mouth daily.    Historical Provider, MD  glipiZIDE (GLUCOTROL) 5 MG tablet Take 0.5 tablets (2.5 mg total) by mouth 2 (two) times daily before a meal. 12/06/14   Reyne Dumas, MD  lisinopril (PRINIVIL,ZESTRIL) 10 MG tablet Take 10 mg by mouth daily. 11/22/14   Historical Provider, MD  lithium carbonate 300 MG capsule Take 300 mg by mouth 2 (two) times daily. Take 600 mg in the morning and 300 mg at bedtime    Historical Provider, MD  polyethylene glycol (MIRALAX / GLYCOLAX) packet Take 17 g by mouth daily as needed for mild constipation. 12/06/14   Reyne Dumas, MD  pravastatin (PRAVACHOL) 20 MG tablet Take 20 mg by mouth daily. 11/08/14   Historical Provider, MD  propranolol (INDERAL) 80 MG tablet Take 80 mg by mouth 2 (two) times daily. 10/29/14   Historical Provider, MD   BP 224/96 mmHg  Pulse 67  Temp(Src) 98.7 F (37.1 C) (Oral)  Resp 20  SpO2 100% Physical Exam  Constitutional: He is oriented to person, place, and time. He appears well-developed and well-nourished. No distress.  HENT:  Head: Normocephalic and atraumatic.  Mouth/Throat: Oropharynx is clear and moist. No oropharyngeal exudate.  Eyes: Conjunctivae are normal.  Neck: Neck supple. No thyromegaly present.  Cardiovascular: Normal rate, regular rhythm and intact distal pulses.   Pulmonary/Chest: Effort normal and breath sounds normal. No respiratory distress. He has no wheezes. He has no rales. He exhibits no tenderness.  Abdominal: Soft. There is no tenderness.  Musculoskeletal: He exhibits no  tenderness.  Lymphadenopathy:    He has no cervical adenopathy.  Neurological: He is alert and oriented to person, place, and time. He has normal strength. A sensory deficit is present. Coordination normal. GCS eye subscore is 4. GCS verbal subscore is 5. GCS motor subscore is 6.  Pt reports decreased 2 pt discrimination in left lower leg down to foot. Pt has equal facial symmetry, equal 5/5 strength with grips and plantar dorsi flexion. He has no pronator drift and normal finger to nose test, rapid finger movement and rapid hand movement and heel to shin test.   Skin: Skin is warm and dry. No rash noted. He is not diaphoretic.  Psychiatric: He has a normal mood and affect.  Nursing note and vitals reviewed.   ED Course  Procedures (including critical care time) Labs Review Labs Reviewed - No data to display  Imaging Review Mr Brain Wo Contrast  12/12/2014   CLINICAL DATA:  64 year old male with left hemisphere lacunar infarct earlier this month affecting the right side. Now with new onset left side weakness and numbness. Initial encounter.  EXAM: MRI HEAD WITHOUT CONTRAST  TECHNIQUE: Multiplanar, multiecho pulse sequences of the brain and surrounding structures were obtained without intravenous contrast.  COMPARISON:  Brain MRI and MRA 12/05/2014.  FINDINGS: On the prior study an acute left thalamic lacunar infarct was demonstrated. The patient is now has developed a slightly larger area of restricted diffusion in the lateral aspect of the right thalamus measuring about 9 mm (series 4, image 27). As on the earlier lesion there is associated mild T2 and FLAIR hyperintensity without hemorrhage or mass effect.  Also in the left thalamus the associated diffusion abnormality extends more caudally in a linear fashion toward the midbrain (series 7, image 16).  No other restricted diffusion. Major intracranial vascular flow voids are stable. No other new signal abnormality. No midline shift, mass effect,  evidence of mass lesion, ventriculomegaly, extra-axial collection or acute intracranial hemorrhage. Cervicomedullary junction and pituitary are within normal limits.  Visible internal auditory structures appear normal. Stable paranasal sinuses and mastoids. Visualized orbit soft tissues are within normal limits. Visualized scalp soft tissues are within normal limits. Normal bone marrow signal.  IMPRESSION: 1. New acute lacunar infarct in the RIGHT thalamus. No associated mass effect or hemorrhage. 2. Recent left thalamic lacunar infarct with mild/expected evolution. No mass effect or hemorrhage. 3. No other new intracranial abnormality.   Electronically Signed   By: Genevie Ann M.D.   On: 12/12/2014 12:09     EKG Interpretation None      MDM   Final diagnoses:  Lacunar infarct, acute   64 yo male presenting with left sided paresthesias in his arm and leg. He was recently discharged with left sided stroke that caused right sided symptoms.  These symptoms began 24 hours ago. Discussed case with Dr. Stark Jock.   8:55 AM: Consulted Dr. Nicole Kindred (neurology) for recommendations, recommends non contrast MRI.    12:15 PM: MRI resulted shows new right sided lacunar infarct.  Consulted Dr. Nicole Kindred with results.  He reviewed pt's findings and current medications.  He recommends pt should remain on aspirin and plavix but needs to increase his lisionpril to 15mg  bid due to his elevated bp in the ED and follow-up with his neurologist this week.    Discussed findings and plan with pt and family. Pt reports adherence to all his meds. Lisinopril 5 mg given in ED to accomplish am dose of 15 mg . Pt has appointment next month with Dr. Erlinda Hong but will call to arrange follow-up appointment this week.  Pt is well-appearing, in no acute distress and vital signs reviewed and not concerning.  He ias able to ambulate in the ED and can tolerate POs. He appears safe to be discharged.  Return precautions provided. Pt and family aware of  plan and in agreement.    Filed Vitals:   12/12/14 1000 12/12/14 1146 12/12/14 1237 12/12/14 1303  BP: 183/83  165/89   Pulse: 64 64 66   Temp:    98.1 F (36.7 C)  TempSrc:    Oral  Resp: 15 13 19    SpO2: 99% 100% 94%    Meds given in ED:  Medications  lisinopril (PRINIVIL,ZESTRIL) tablet 5 mg (5 mg Oral Given 12/12/14 1302)    Discharge Medication List as of 12/12/2014 12:51 PM        Britt Bottom, NP 12/13/14 [place], MD 12/13/14 1527

## 2014-12-12 NOTE — ED Notes (Signed)
Pt reports stroke last week. Reports right side mostly affected. Pt reports since 10am yesterday morning developed left sided weakness, numbness, dropping objects with left hand. Pt reports recent anxiety. Pt currently A&O, NAD.

## 2014-12-12 NOTE — Discharge Instructions (Signed)
Please follow the directions provided. Your MRI showed a new stroke responsible for the left-sided numbness. I discussed these findings with Dr. Nicole Kindred, from neurology. Be sure to follow-up with your neurologist later this week for re-evaluation. Please change her lisinopril dose to 15 mg twice a day. All your other medications you may continue as previously directed. Don't hesitate to return for any new new, worsening, or concerning symptoms.   SEEK IMMEDIATE MEDICAL CARE IF:  Any of these symptoms may represent a serious problem that is an emergency. Do not wait to see if the symptoms will go away. Get medical help right away. Call your local emergency services (911 in [place] Do not drive yourself to the hospital.  You have sudden weakness or numbness of the face, arm, or leg, especially on one side of the body.  You have sudden trouble walking or difficulty moving arms or legs.  You have sudden confusion.  You have trouble speaking (aphasia) or understanding.  You have sudden trouble seeing in one or both eyes.  You have a loss of balance or coordination.  You have a sudden, severe headache with no known cause.  You have new chest pain or an irregular heartbeat.  You have a partial or total loss of consciousness.

## 2014-12-18 DIAGNOSIS — I10 Essential (primary) hypertension: Secondary | ICD-10-CM | POA: Diagnosis not present

## 2014-12-18 DIAGNOSIS — F339 Major depressive disorder, recurrent, unspecified: Secondary | ICD-10-CM | POA: Diagnosis not present

## 2014-12-18 DIAGNOSIS — I639 Cerebral infarction, unspecified: Secondary | ICD-10-CM | POA: Diagnosis not present

## 2014-12-18 DIAGNOSIS — Z09 Encounter for follow-up examination after completed treatment for conditions other than malignant neoplasm: Secondary | ICD-10-CM | POA: Diagnosis not present

## 2014-12-18 DIAGNOSIS — E1121 Type 2 diabetes mellitus with diabetic nephropathy: Secondary | ICD-10-CM | POA: Diagnosis not present

## 2014-12-19 ENCOUNTER — Telehealth: Payer: Self-pay | Admitting: Radiology

## 2014-12-19 NOTE — Telephone Encounter (Signed)
Left message to return my call for scheduling carotid doppler.

## 2014-12-26 DIAGNOSIS — I10 Essential (primary) hypertension: Secondary | ICD-10-CM | POA: Diagnosis not present

## 2014-12-28 DIAGNOSIS — E119 Type 2 diabetes mellitus without complications: Secondary | ICD-10-CM | POA: Diagnosis not present

## 2014-12-28 DIAGNOSIS — I10 Essential (primary) hypertension: Secondary | ICD-10-CM | POA: Diagnosis not present

## 2015-01-01 ENCOUNTER — Telehealth: Payer: Self-pay | Admitting: Radiology

## 2015-01-01 NOTE — Telephone Encounter (Signed)
Left v/ message to return call for scheduling Carotid doppler

## 2015-01-03 ENCOUNTER — Telehealth: Payer: Self-pay | Admitting: Radiology

## 2015-01-03 NOTE — Telephone Encounter (Signed)
Received voice message to return call to patient.  I returned call at 1:39pm and did not get an answer.  Asked for him to return my call and I would also try to reach him again.

## 2015-01-04 DIAGNOSIS — I10 Essential (primary) hypertension: Secondary | ICD-10-CM | POA: Diagnosis not present

## 2015-01-10 ENCOUNTER — Encounter: Payer: Self-pay | Admitting: Neurology

## 2015-01-10 ENCOUNTER — Ambulatory Visit (INDEPENDENT_AMBULATORY_CARE_PROVIDER_SITE_OTHER): Payer: Medicare Other | Admitting: Neurology

## 2015-01-10 VITALS — BP 168/89 | HR 97 | Ht 74.5 in | Wt 229.4 lb

## 2015-01-10 DIAGNOSIS — I63332 Cerebral infarction due to thrombosis of left posterior cerebral artery: Secondary | ICD-10-CM

## 2015-01-10 DIAGNOSIS — E785 Hyperlipidemia, unspecified: Secondary | ICD-10-CM

## 2015-01-10 DIAGNOSIS — R002 Palpitations: Secondary | ICD-10-CM | POA: Diagnosis not present

## 2015-01-10 DIAGNOSIS — I10 Essential (primary) hypertension: Secondary | ICD-10-CM | POA: Diagnosis not present

## 2015-01-10 DIAGNOSIS — E1159 Type 2 diabetes mellitus with other circulatory complications: Secondary | ICD-10-CM

## 2015-01-10 DIAGNOSIS — I63339 Cerebral infarction due to thrombosis of unspecified posterior cerebral artery: Secondary | ICD-10-CM

## 2015-01-10 MED ORDER — AMLODIPINE BESYLATE 10 MG PO TABS: 10.0000 mg | ORAL_TABLET | Freq: Every day | ORAL | Status: DC

## 2015-01-10 NOTE — Patient Instructions (Signed)
-   continue ASA and plavix for another 2 more months and then plavix alone - your BP is still high, will prescribe amlodipine 10mg  for better BP control. Continue propranolol. - check BP and glucose at home - Follow up with your primary care physician for stroke risk factor modification. Recommend maintain blood pressure goal <130/80, diabetes with hemoglobin A1c goal below 6.5% and lipids with LDL cholesterol goal below 70 mg/dL.  - will check 30 day cardiac event monitoring - will do head ultrasound for emboli detection - regular exercise - follow up in 3 months.

## 2015-01-11 ENCOUNTER — Ambulatory Visit: Payer: Self-pay | Admitting: Neurology

## 2015-01-11 DIAGNOSIS — E1159 Type 2 diabetes mellitus with other circulatory complications: Secondary | ICD-10-CM | POA: Insufficient documentation

## 2015-01-11 DIAGNOSIS — E785 Hyperlipidemia, unspecified: Secondary | ICD-10-CM | POA: Insufficient documentation

## 2015-01-11 NOTE — Progress Notes (Signed)
STROKE NEUROLOGY FOLLOW UP NOTE  NAME: Suleyman [place] DOB: 1951/07/18  REASON FOR VISIT: stroke follow up HISTORY FROM: pt and wife and chart  Today we had the pleasure of seeing Tage Scholze [place] in follow-up at our Neurology Clinic. Pt was accompanied by wife.   History Summary Mr. Keiran Weyer Mikhail is a 64 year old male with history of hypertension, diabetes, hyperlipidemia, OSA not on CPAP presented with right-sided numbness on 12/06/14. MRI showed left thalamic stroke, consistent with small vessel disease in left PCA territory. 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 He was put on dural antiplatelet and continued on statin. LDL 65 and A1C 6.6. 2D echo not remarkable but carotid doppler was not done before discharge.   He went back to ER on 12/12/14 due to new onset left sided numbness and mild weakness. Had MRI done again showed right thalamic stroke. His neuro stable and he was sent home from ER.   Interval History During the interval time, the patient has been doing well. No more recurrent symptoms. He still has mild bilateral hands numbness intermittently, but no weakness. His BP 168/89 today, as he run out of his propranolol. His glucose controlled well at home as per pt. He follows with Dr. Justin Mend regularly.  REVIEW OF SYSTEMS: Full 14 system review of systems performed and notable only for those listed below and in HPI above, all others are negative:  Constitutional:  fatigue Cardiovascular:  Ear/Nose/Throat:   Skin:  Eyes:   Respiratory:  Cough, SOB Gastroitestinal:   Genitourinary: frequency of urination Hematology/Lymphatic:   Endocrine: excessive thirst Musculoskeletal:   Allergy/Immunology:   Neurological:  Memory loss, numbness, weakness, tremors Psychiatric: agitation, depression, nervous/anxious Sleep: frequent waking, daytime sleepiness  The following represents the patient's updated allergies and side effects list: Allergies  Allergen Reactions  .  Alka-Seltzer Plus Cold [Chlorphen-Phenyleph-Asa] Other (See Comments)    "Knocks me out"    The neurologically relevant items on the patient's problem list were reviewed on today's visit.  Neurologic Examination  A problem focused neurological exam (12 or more points of the single system neurologic examination, vital signs counts as 1 point, cranial nerves count for 8 points) was performed.  Blood pressure 168/89, pulse 97, height 6' 2.5" (1.892 m), weight 229 lb 6.4 oz (104.055 kg).  General - Well nourished, well developed, in no apparent distress.  Ophthalmologic - Sharp disc margins OU.  Cardiovascular - Regular rate and rhythm with no murmur.  Mental Status -  Level of arousal and orientation to time, place, and person were intact. Language including expression, naming, repetition, comprehension was assessed and found intact. Fund of Knowledge was assessed and was intact.  Cranial Nerves II - XII - II - Visual field intact OU. III, IV, VI - Extraocular movements intact. V - Facial sensation intact bilaterally. VII - Facial movement intact bilaterally. VIII - Hearing & vestibular intact bilaterally. X - Palate elevates symmetrically. XI - Chin turning & shoulder shrug intact bilaterally. XII - Tongue protrusion intact.  Motor Strength - The patient's strength was normal in all extremities and pronator drift was absent.  Bulk was normal and fasciculations were absent.   Motor Tone - Muscle tone was assessed at the neck and appendages and was normal.  Reflexes - The patient's reflexes were normal in all extremities and he had no pathological reflexes.  Sensory - Light touch, temperature/pinprick were assessed and were normal.    Coordination - The patient had normal movements  in the hands and feet with no ataxia or dysmetria.  Tremor was absent.  Gait and Station - The patient's transfers, posture, gait, station, and turns were observed as normal.  Data reviewed: I  personally reviewed the images and agree with the radiology interpretations.  2D echo - Left ventricle: The cavity size was normal. Wall thickness was increased in a pattern of mild LVH. Systolic function was normal. The estimated ejection fraction was in the range of 60% to 65%. Wall motion was normal; there were no regional wall motion abnormalities. Doppler parameters are consistent with abnormal left ventricular relaxation (grade 1 diastolic dysfunction). - Pulmonary arteries: Systolic pressure was mildly increased. Impressions: - Normal LV function; mild LVH; grade 1 diastolic dysfunction; trace MR.  MRI and MRA 12/05/14 Small acute infarct left thalamus. 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 There is atherosclerotic disease in the basilar and posterior cerebral arteries bilaterally.  MRI 12/12/14 1. New acute lacunar infarct in the RIGHT thalamus. No associated mass effect or hemorrhage. 2. Recent left thalamic lacunar infarct with mild/expected evolution. No mass effect or hemorrhage. 3. No other new intracranial abnormality.  Component     Latest Ref Rng 12/05/2014 12/06/2014  Cholesterol     0 - 200 mg/dL  154  Triglycerides     <150 mg/dL  291 (H)  HDL Cholesterol     >39 mg/dL  31 (L)  Total CHOL/HDL Ratio       5.0  VLDL     0 - 40 mg/dL  58 (H)  LDL (calc)     0 - 99 mg/dL  65  Hemoglobin A1C     4.8 - 5.6 % 6.8 (H)   Mean Plasma Glucose      148   TSH     0.350 - 4.500 uIU/mL 3.212   Vitamin B-12     211 - 911 pg/mL 1330 (H)   Folate      >20.0     Assessment: As you may recall, he is a 64 y.o. Caucasian male with PMH of HTN, HLD, DM, OSA not on CPAP was admitted on 12/05/14 for left thalamic stroke. MRA showed bilateral PCA and right VA atherosclerosis. Again found to  have right thalamic stroke on 12/12/14, confirmed with MRI. His bilateral stroke still consistent with several disease of veterbrobasilar atherosclerosis, especially b/l PCA, less likely cardioembolic. Will continue dural antiplatelet, but will check TCD MES, CUS and 30 day cardiac event monitoring.   Plan:  - continue ASA and plavix for total 3 months and then plavix alone - BP is still high, increase to amlodipine 10mg  for better BP control. Continue propranolol. - check BP and glucose at home - Follow up with primary care physician for stroke risk factor modification. Recommend maintain blood pressure goal <130/80, diabetes with hemoglobin A1c goal below 6.5% and lipids with LDL cholesterol goal below 70 mg/dL.  - will check 30 day cardiac event monitoring - will do CUS and TCD emboli detection - RTC in 3 months.  Orders Placed This Encounter  Procedures  . [place] TCD [facility]    Standing Status: Future     Number of Occurrences:      Standing Expiration Date: 07/13/2015    Order Specific Question:  Reason for Exam (SYMPTOM  OR DIAGNOSIS REQUIRED)    Answer:  stroke bilateral  Order Specific Question:  Preferred imaging location?    Answer:  Internal  . [place] TCD WITH BUBBLES    Standing Status: Future     Number of Occurrences:      Standing Expiration Date: 07/13/2015    Order Specific Question:  Reason for Exam (SYMPTOM  OR DIAGNOSIS REQUIRED)    Answer:  stroke bilateral    Order Specific Question:  Preferred imaging location?    Answer:  Internal  . US Carotid Bilateral    Standing Status: Future     Number of Occurrences:      Standing Expiration Date: 03/14/2016    Order Specific Question:  Reason for Exam (SYMPTOM  OR DIAGNOSIS REQUIRED)    Answer:  stroke    Order Specific Question:  Preferred imaging location?    Answer:  Internal  . Cardiac event monitor    Standing Status: Future     Number of Occurrences: 1     Standing Expiration Date: 01/11/2016    Scheduling  Instructions:     Request cardionet setup. Thank you.    Order Specific Question:  Where should this test be performed    Answer:  [facility] [facility]    Meds ordered this encounter  Medications  . DISCONTD: AMLODIPINE BESYLATE PO    Sig: Take 5 mg by mouth daily.  Marland Kitchen amLODipine (NORVASC) 10 MG tablet    Sig: Take 1 tablet (10 mg total) by mouth daily.    Dispense:  90 tablet    Refill:  3    Patient Instructions  - continue ASA and plavix for another 2 more months and then plavix alone - your BP is still high, will prescribe amlodipine 10mg  for better BP control. Continue propranolol. - check BP and glucose at home - Follow up with your primary care physician for stroke risk factor modification. Recommend maintain blood pressure goal <130/80, diabetes with hemoglobin A1c goal below 6.5% and lipids with LDL cholesterol goal below 70 mg/dL.  - will check 30 day cardiac event monitoring - will do head ultrasound for emboli detection - regular exercise - follow up in 3 months.    Rosalin Hawking, MD PhD [facility] [address] [place], [place] [address] [phone]

## 2015-01-16 ENCOUNTER — Telehealth: Payer: Self-pay | Admitting: Radiology

## 2015-01-16 NOTE — Telephone Encounter (Signed)
Called the only number listed for patient and left voice message to please call back for scheduling the Carotid and Emboli/Bubble study.

## 2015-01-17 ENCOUNTER — Encounter: Payer: Self-pay | Admitting: *Deleted

## 2015-01-17 ENCOUNTER — Ambulatory Visit (INDEPENDENT_AMBULATORY_CARE_PROVIDER_SITE_OTHER): Payer: Medicare Other

## 2015-01-17 DIAGNOSIS — R002 Palpitations: Secondary | ICD-10-CM

## 2015-01-17 NOTE — Progress Notes (Signed)
Patient ID: David Glass, male   DOB: 1951/01/27, 64 y.o.   MRN: [id number] [employer] verite 30 day cardiac event monitor applied to patient.  No [employer] monitors were available.

## 2015-01-30 ENCOUNTER — Telehealth: Payer: Self-pay | Admitting: Radiology

## 2015-01-30 NOTE — Telephone Encounter (Signed)
Called patient 3/30, 4/12, 4/14, 01/16/15 Left voice messages and patient did not respond for scheduling.

## 2015-02-27 DIAGNOSIS — N183 Chronic kidney disease, stage 3 (moderate): Secondary | ICD-10-CM | POA: Diagnosis not present

## 2015-02-27 DIAGNOSIS — I10 Essential (primary) hypertension: Secondary | ICD-10-CM | POA: Diagnosis not present

## 2015-04-16 ENCOUNTER — Encounter: Payer: Self-pay | Admitting: Neurology

## 2015-04-16 ENCOUNTER — Ambulatory Visit (INDEPENDENT_AMBULATORY_CARE_PROVIDER_SITE_OTHER): Payer: Medicare Other | Admitting: Neurology

## 2015-04-16 VITALS — BP 120/68 | HR 62 | Ht 75.0 in | Wt 222.2 lb

## 2015-04-16 DIAGNOSIS — I63339 Cerebral infarction due to thrombosis of unspecified posterior cerebral artery: Secondary | ICD-10-CM

## 2015-04-16 DIAGNOSIS — E785 Hyperlipidemia, unspecified: Secondary | ICD-10-CM | POA: Diagnosis not present

## 2015-04-16 DIAGNOSIS — I63332 Cerebral infarction due to thrombosis of left posterior cerebral artery: Secondary | ICD-10-CM | POA: Diagnosis not present

## 2015-04-16 DIAGNOSIS — I10 Essential (primary) hypertension: Secondary | ICD-10-CM

## 2015-04-16 DIAGNOSIS — E1159 Type 2 diabetes mellitus with other circulatory complications: Secondary | ICD-10-CM

## 2015-04-16 NOTE — Progress Notes (Signed)
STROKE NEUROLOGY FOLLOW UP NOTE  NAME: David Glass DOB: June 29, 1951  REASON FOR VISIT: stroke follow up HISTORY FROM: pt and wife and chart  Today we had the pleasure of seeing David Glass in follow-up at our Neurology Clinic. Pt was accompanied by wife.   History Summary Mr. David Glass is a 64 year old male with history of hypertension, diabetes, hyperlipidemia, OSA not on CPAP presented with right-sided numbness on 12/06/14. MRI showed left thalamic stroke, consistent with small vessel disease in left PCA territory. 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 He was put on dural antiplatelet and continued on statin. LDL 65 and A1C 6.6. 2D echo not remarkable but carotid doppler was not done before discharge.   He went back to ER on 12/12/14 due to new onset left sided numbness and mild weakness. Had MRI done again showed right thalamic stroke. His neuro stable and he was sent home from ER.   01/10/15 follow up - the patient has been doing well. No more recurrent symptoms. He still has mild bilateral hands numbness intermittently, but no weakness. His BP 168/89 today, as he run out of his propranolol. His glucose controlled well at home as per pt. He follows with Dr. Justin Glass regularly.  Interval History During the interval time, he was doing well. His ASA has been discontinued after total 3 months of dural antiplatelet. His BP in good control at home and today is 120/68 in clinic. His glucose at home also < 120 most of the time. He still has post stroke fatigue so not doing much exercise every day. He is requesting to limit his David Glass duty for the next 6 months. 30 day cardiac monitoring negative for afib. But CUS and TCD not done yet.  REVIEW OF SYSTEMS: Full 14 system review of systems performed and notable only for those listed below and in HPI above, all others are negative:  Constitutional:  Appetite change, unexpected wt change Cardiovascular:  Ear/Nose/Throat:   Skin:    Eyes:   Respiratory:   Gastroitestinal:   Genitourinary: frequency of urination Hematology/Lymphatic:   Endocrine:  Musculoskeletal:   Allergy/Immunology:   Neurological:  Psychiatric: depression Sleep: daytime sleepiness  The following represents the patient's updated allergies and side effects list: Allergies  Allergen Reactions  . Alka-Seltzer Plus Cold [Chlorphen-Phenyleph-Asa] Other (See Comments)    "Knocks me out"    The neurologically relevant items on the patient's problem list were reviewed on today's visit.  Neurologic Examination  A problem focused neurological exam (12 or more points of the single system neurologic examination, vital signs counts as 1 point, cranial nerves count for 8 points) was performed.  Blood pressure 120/68, pulse 62, height 6\' 3"  (1.905 m), weight 222 lb 3.2 oz (100.789 kg).  General - Well nourished, well developed, in no apparent distress.  Ophthalmologic - Sharp disc margins OU.  Cardiovascular - Regular rate and rhythm with no murmur.  Mental Status -  Level of arousal and orientation to time, place, and person were intact. Language including expression, naming, repetition, comprehension was assessed and found intact. Fund of Knowledge was assessed and was intact.  Cranial Nerves II - XII - II - Visual field intact OU. III, IV, VI - Extraocular movements intact. V - Facial sensation intact bilaterally. VII - Facial movement intact bilaterally. VIII - Hearing & vestibular intact bilaterally. X - Palate elevates symmetrically. XI - Chin turning & shoulder shrug intact bilaterally. XII - Tongue protrusion intact.  Motor Strength - The patient's strength was normal in all extremities and pronator drift was absent.  Bulk was normal and fasciculations were absent.   Motor Tone - Muscle tone was assessed at the neck and appendages and was normal.  Reflexes - The patient's reflexes were normal in all extremities and he had no  pathological reflexes.  Sensory - Light touch, temperature/pinprick were assessed and were normal.    Coordination - The patient had normal movements in the hands and feet with no ataxia or dysmetria.  Tremor was absent.  Gait and Station - The patient's transfers, posture, gait, station, and turns were observed as normal.  Data reviewed: I personally reviewed the images and agree with the radiology interpretations.  2D echo - Left ventricle: The cavity size was normal. Wall thickness was increased in a pattern of mild LVH. Systolic function was normal. The estimated ejection fraction was in the range of 60% to 65%. Wall motion was normal; there were no regional wall motion abnormalities. Doppler parameters are consistent with abnormal left ventricular relaxation (grade 1 diastolic dysfunction). - Pulmonary arteries: Systolic pressure was mildly increased. Impressions: - Normal LV function; mild LVH; grade 1 diastolic dysfunction; trace MR.  MRI and MRA 12/05/14 Small acute infarct left thalamus. 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 There is atherosclerotic disease in the basilar and posterior cerebral arteries bilaterally.  MRI 12/12/14 1. New acute lacunar infarct in the RIGHT thalamus. No associated mass effect or hemorrhage. 2. Recent left thalamic lacunar infarct with mild/expected evolution. No mass effect or hemorrhage. 3. No other new intracranial abnormality.  30 day cardiac monitoring - no afib  Component     Latest Ref Rng 12/05/2014 12/06/2014  Cholesterol     0 - 200 mg/dL  154  Triglycerides     <150 mg/dL  291 (H)  HDL Cholesterol     >39 mg/dL  31 (L)  Total CHOL/HDL Ratio       5.0  VLDL     0 - 40 mg/dL  58 (H)  LDL (calc)     0 - 99 mg/dL  65  Hemoglobin A1C      4.8 - 5.6 % 6.8 (H)   Mean Plasma Glucose      148   TSH     0.350 - 4.500 uIU/mL 3.212   Vitamin B-12     211 - 911 pg/mL 1330 (H)   Folate      >20.0     Assessment: As you may recall, he is a 64 y.o. Caucasian male with PMH of HTN, HLD, DM, OSA not on CPAP was admitted on 12/05/14 for left thalamic stroke. MRA showed bilateral PCA and right VA atherosclerosis. Again found to have right thalamic stroke on 12/12/14, confirmed with MRI. His bilateral stroke still consistent with several disease of veterbrobasilar atherosclerosis, especially b/l PCA, less likely cardioembolic. Will continue dural antiplatelet. 30 day cardiac event monitoring negative for afib, but CUS and TCD have not done yet.   Plan:  - continue plavix and pravastatin for stroke prevention. - check BP and glucose at home - Follow up with your primary care physician for stroke risk factor modification. Recommend maintain blood pressure goal <130/80, diabetes with hemoglobin A1c goal below 6.5% and lipids with LDL cholesterol goal below 70 mg/dL.  - home exercise - will  do CUS and TCD emboli detection - RTC in 3 months.  Orders Placed This Encounter  Procedures  . US Carotid Bilateral    Same order was cancelled last time    Standing Status: Future     Number of Occurrences:      Standing Expiration Date: 06/16/2016    Order Specific Question:  Reason for Exam (SYMPTOM  OR DIAGNOSIS REQUIRED)    Answer:  bilateral strokes. Order was cancelled last time without clear reason    Order Specific Question:  Preferred imaging location?    Answer:  Internal  . [place] TCD WITHMONITORING    Standing Status: Future     Number of Occurrences:      Standing Expiration Date: 10/17/2015    Scheduling Instructions:     Same order was cancelled last time.    Order Specific Question:  Reason for Exam (SYMPTOM  OR DIAGNOSIS REQUIRED)    Answer:  bilateral strokes. Order was cancelled last time without clear reason    Order Specific  Question:  Preferred imaging location?    Answer:  Internal    Meds ordered this encounter  Medications  . glipiZIDE (GLUCOTROL XL) 2.5 MG 24 hr tablet    Sig: Take 2.5 mg by mouth daily.    Refill:  11    Patient Instructions  - continue plavix and pravastatin for stroke prevention. - check BP and glucose at home - Follow up with your primary care physician for stroke risk factor modification. Recommend maintain blood pressure goal <130/80, diabetes with hemoglobin A1c goal below 6.5% and lipids with LDL cholesterol goal below 70 mg/dL.  - will do head and neck ultrasound to finish off stroke work up - regular exercise, 3-4 times week, each time 20-30 min and break out sweat - follow up in 6 months.    Rosalin Hawking, MD PhD [facility] [address] [place], [place] [address] [phone]

## 2015-04-16 NOTE — Patient Instructions (Addendum)
-   continue plavix and pravastatin for stroke prevention. - check BP and glucose at home - Follow up with your primary care physician for stroke risk factor modification. Recommend maintain blood pressure goal <130/80, diabetes with hemoglobin A1c goal below 6.5% and lipids with LDL cholesterol goal below 70 mg/dL.  - will do head and neck ultrasound to finish off stroke work up - regular exercise, 3-4 times week, each time 20-30 min and break out sweat - follow up in 6 months.

## 2015-05-09 DIAGNOSIS — E785 Hyperlipidemia, unspecified: Secondary | ICD-10-CM | POA: Diagnosis not present

## 2015-05-09 DIAGNOSIS — Z1211 Encounter for screening for malignant neoplasm of colon: Secondary | ICD-10-CM | POA: Diagnosis not present

## 2015-05-09 DIAGNOSIS — E1121 Type 2 diabetes mellitus with diabetic nephropathy: Secondary | ICD-10-CM | POA: Diagnosis not present

## 2015-05-09 DIAGNOSIS — Z23 Encounter for immunization: Secondary | ICD-10-CM | POA: Diagnosis not present

## 2015-05-09 DIAGNOSIS — Z0000 Encounter for general adult medical examination without abnormal findings: Secondary | ICD-10-CM | POA: Diagnosis not present

## 2015-05-10 ENCOUNTER — Other Ambulatory Visit: Payer: Self-pay | Admitting: Internal Medicine

## 2015-05-21 DIAGNOSIS — F3131 Bipolar disorder, current episode depressed, mild: Secondary | ICD-10-CM | POA: Diagnosis not present

## 2015-05-30 DIAGNOSIS — Z1211 Encounter for screening for malignant neoplasm of colon: Secondary | ICD-10-CM | POA: Diagnosis not present

## 2015-06-18 DIAGNOSIS — N183 Chronic kidney disease, stage 3 (moderate): Secondary | ICD-10-CM | POA: Diagnosis not present

## 2015-06-24 DIAGNOSIS — N183 Chronic kidney disease, stage 3 (moderate): Secondary | ICD-10-CM | POA: Diagnosis not present

## 2015-06-24 DIAGNOSIS — E119 Type 2 diabetes mellitus without complications: Secondary | ICD-10-CM | POA: Diagnosis not present

## 2015-06-24 DIAGNOSIS — I10 Essential (primary) hypertension: Secondary | ICD-10-CM | POA: Diagnosis not present

## 2015-06-25 DIAGNOSIS — F3131 Bipolar disorder, current episode depressed, mild: Secondary | ICD-10-CM | POA: Diagnosis not present

## 2015-07-04 DIAGNOSIS — I10 Essential (primary) hypertension: Secondary | ICD-10-CM | POA: Diagnosis not present

## 2015-07-04 DIAGNOSIS — N183 Chronic kidney disease, stage 3 (moderate): Secondary | ICD-10-CM | POA: Diagnosis not present

## 2015-08-07 DIAGNOSIS — E1121 Type 2 diabetes mellitus with diabetic nephropathy: Secondary | ICD-10-CM | POA: Diagnosis not present

## 2015-08-07 DIAGNOSIS — E119 Type 2 diabetes mellitus without complications: Secondary | ICD-10-CM | POA: Diagnosis not present

## 2015-09-26 DIAGNOSIS — N183 Chronic kidney disease, stage 3 (moderate): Secondary | ICD-10-CM | POA: Diagnosis not present

## 2015-10-03 DIAGNOSIS — N183 Chronic kidney disease, stage 3 (moderate): Secondary | ICD-10-CM | POA: Diagnosis not present

## 2015-10-17 ENCOUNTER — Ambulatory Visit (INDEPENDENT_AMBULATORY_CARE_PROVIDER_SITE_OTHER): Payer: Medicare Other | Admitting: Neurology

## 2015-10-17 ENCOUNTER — Encounter: Payer: Self-pay | Admitting: Neurology

## 2015-10-17 VITALS — Ht 75.0 in | Wt 234.8 lb

## 2015-10-17 DIAGNOSIS — E1142 Type 2 diabetes mellitus with diabetic polyneuropathy: Secondary | ICD-10-CM | POA: Insufficient documentation

## 2015-10-17 DIAGNOSIS — I63339 Cerebral infarction due to thrombosis of unspecified posterior cerebral artery: Secondary | ICD-10-CM

## 2015-10-17 DIAGNOSIS — I10 Essential (primary) hypertension: Secondary | ICD-10-CM

## 2015-10-17 DIAGNOSIS — E785 Hyperlipidemia, unspecified: Secondary | ICD-10-CM

## 2015-10-17 DIAGNOSIS — E1159 Type 2 diabetes mellitus with other circulatory complications: Secondary | ICD-10-CM

## 2015-10-17 MED ORDER — GABAPENTIN 100 MG PO CAPS: ORAL_CAPSULE | ORAL | Status: DC

## 2015-10-17 NOTE — Patient Instructions (Signed)
-   continue plavix and pravastatin for stroke prevention. - check BP and glucose at home - Follow up with your primary care physician for stroke risk factor modification. Recommend maintain blood pressure goal <130/80, diabetes with hemoglobin A1c goal below 6.5% and lipids with LDL cholesterol goal below 70 mg/dL.  - will start gabapentin 100mg  once at night for 7 days and then 100mg  twice a day for 7 days, and then 100mg  three times a day after - will do carotid ultrasound to evaluate carotid arteries. - follow up in 6 months.

## 2015-10-17 NOTE — Progress Notes (Signed)
STROKE NEUROLOGY FOLLOW UP NOTE  NAME: David Glass DOB: 1951-06-07  REASON FOR VISIT: stroke follow up HISTORY FROM: pt and wife and chart  Today we had the pleasure of seeing David Glass in follow-up at our Neurology Clinic. Pt was accompanied by wife.   History Summary Mr. David Glass is a 65 year old male with history of hypertension, diabetes, hyperlipidemia, OSA not on CPAP presented with right-sided numbness on 12/06/14. MRI showed left thalamic stroke, consistent with small vessel disease in left PCA territory. 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 He was put on dural antiplatelet and continued on statin. LDL 65 and A1C 6.6. 2D echo not remarkable but carotid doppler was not done before discharge.   He went back to ER on 12/12/14 due to new onset left sided numbness and mild weakness. Had MRI done again showed right thalamic stroke. His neuro stable and he was sent home from ER.   01/10/15 follow up - the patient has been doing well. No more recurrent symptoms. He still has mild bilateral hands numbness intermittently, but no weakness. His BP 168/89 today, as he run out of his propranolol. His glucose controlled well at home as per pt. He follows with Dr. Justin Mend regularly.  04/16/15 follow up - he was doing well. His ASA has been discontinued after total 3 months of dural antiplatelet. His BP in good control at home and today is 120/68 in clinic. His glucose at home also < 120 most of the time. He still has post stroke fatigue so not doing much exercise every day. He is requesting to limit his Madaline Savage duty for the next 6 months. 30 day cardiac monitoring negative for afib. But CUS and TCD not done yet.  Interval History During the interval time, pt has been doing well from stroke standpoint. However, he continues to have bilateral hand numbness tingling to the point that he has to wear golves today with him due to the discomfort. He also has numbness tingling in  bilateral toes but not as bad as fingers. He has tried on time with 300mg  gabapentin but it made him sleepy. BP at home good, today 123/70. Glucose at 110-120, most recent A1C 6.7. However, CUS has not been done yet.  REVIEW OF SYSTEMS: Full 14 system review of systems performed and notable only for those listed below and in HPI above, all others are negative:  Constitutional:  Cardiovascular:  Ear/Nose/Throat:  Ringing in ears Skin:  Eyes:   Respiratory:  cough Gastroitestinal:   Genitourinary: frequency of urination Hematology/Lymphatic:   Endocrine:  Musculoskeletal:   Allergy/Immunology:   Neurological: numbness Psychiatric:  Sleep: sleep talking  The following represents the patient's updated allergies and side effects list: Allergies  Allergen Reactions  . Alka-Seltzer Plus Cold [Chlorphen-Phenyleph-Asa] Other (See Comments)    "Knocks me out"    The neurologically relevant items on the patient's problem list were reviewed on today's visit.  Neurologic Examination  A problem focused neurological exam (12 or more points of the single system neurologic examination, vital signs counts as 1 point, cranial nerves count for 8 points) was performed.  Height 6\' 3"  (1.905 m), weight 234 lb 12.8 oz (106.505 kg).  General - Well nourished, well developed, in no apparent distress.  Ophthalmologic - Sharp disc margins OU.  Cardiovascular - Regular rate and rhythm with no murmur.  Mental Status -  Level of arousal and orientation to time, place, and person were intact. Language including  expression, naming, repetition, comprehension was assessed and found intact. Fund of Knowledge was assessed and was intact.  Cranial Nerves II - XII - II - Visual field intact OU. III, IV, VI - Extraocular movements intact. V - Facial sensation intact bilaterally. VII - Facial movement intact bilaterally. VIII - Hearing & vestibular intact bilaterally. X - Palate elevates symmetrically. XI  - Chin turning & shoulder shrug intact bilaterally. XII - Tongue protrusion intact.  Motor Strength - The patient's strength was normal in all extremities and pronator drift was absent.  Bulk was normal and fasciculations were absent.   Motor Tone - Muscle tone was assessed at the neck and appendages and was normal.  Reflexes - The patient's reflexes were normal in all extremities and he had no pathological reflexes.  Sensory - Light touch, temperature/pinprick were assessed and were symmetrical except bilateral hand decreased light touch and pinprick.    Coordination - The patient had normal movements in the hands and feet with no ataxia or dysmetria.  Tremor was absent.  Gait and Station - The patient's transfers, posture, gait, station, and turns were observed as normal.  Data reviewed: I personally reviewed the images and agree with the radiology interpretations.  2D echo - Left ventricle: The cavity size was normal. Wall thickness was increased in a pattern of mild LVH. Systolic function was normal. The estimated ejection fraction was in the range of 60% to 65%. Wall motion was normal; there were no regional wall motion abnormalities. Doppler parameters are consistent with abnormal left ventricular relaxation (grade 1 diastolic dysfunction). - Pulmonary arteries: Systolic pressure was mildly increased. Impressions: - Normal LV function; mild LVH; grade 1 diastolic dysfunction; trace MR.  MRI and MRA 12/05/14 Small acute infarct left thalamus. 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 There is atherosclerotic disease in the basilar and posterior cerebral arteries bilaterally.  MRI 12/12/14 1. New acute lacunar infarct in the RIGHT thalamus. No associated mass effect or hemorrhage. 2. Recent  left thalamic lacunar infarct with mild/expected evolution. No mass effect or hemorrhage. 3. No other new intracranial abnormality.  30 day cardiac monitoring - no afib  Component     Latest Ref Rng 12/05/2014 12/06/2014  Cholesterol     0 - 200 mg/dL  154  Triglycerides     <150 mg/dL  291 (H)  HDL Cholesterol     >39 mg/dL  31 (L)  Total CHOL/HDL Ratio       5.0  VLDL     0 - 40 mg/dL  58 (H)  LDL (calc)     0 - 99 mg/dL  65  Hemoglobin A1C     4.8 - 5.6 % 6.8 (H)   Mean Plasma Glucose      148   TSH     0.350 - 4.500 uIU/mL 3.212   Vitamin B-12     211 - 911 pg/mL 1330 (H)   Folate      >20.0     Assessment: As you may recall, he is a 65 y.o. Caucasian male with PMH of HTN, HLD, DM, OSA not on CPAP was admitted on 12/05/14 for left thalamic stroke. MRA showed bilateral PCA and right VA atherosclerosis. Again found to have right thalamic stroke on 12/12/14, confirmed with MRI. His bilateral stroke still consistent with several disease of veterbrobasilar atherosclerosis, especially b/l  PCA, less likely cardioembolic. Will continue dural antiplatelet. 30 day cardiac event monitoring negative for afib, but CUS has not done yet. He has bilateral fingers and toes numbness and tingling, most likely due to diabetic neuropathy. Will give low dose gabapentin.   Plan:  - continue plavix and pravastatin for stroke prevention. - check BP and glucose at home - Follow up with your primary care physician for stroke risk factor modification. Recommend maintain blood pressure goal <130/80, diabetes with hemoglobin A1c goal below 6.5% and lipids with LDL cholesterol goal below 70 mg/dL.  - will start gabapentin with titration to 100mg  tid - will check CUS. - follow up in 6 months.  I spent more than 25 minutes of face to face time with the patient. Greater than 50% of time was spent in counseling and coordination of care. We have discussed about management of diabetic neuropathy and further  stroke work up with CUS.   No orders of the defined types were placed in this encounter.    Meds ordered this encounter  Medications  . lisinopril (PRINIVIL,ZESTRIL) 5 MG tablet    Sig: Take 5 mg by mouth at bedtime.    Refill:  12  . LYRICA 75 MG capsule    Sig: Take 75 mg by mouth 2 (two) times daily.    Refill:  5  . propranolol (INDERAL) 80 MG tablet    Sig: Take 80 mg by mouth 3 (three) times daily.  Marland Kitchen gabapentin (NEURONTIN) 100 MG capsule    Sig: 100mg  Qhs for one week, then 100mg  bid for one week and then 100mg  tid.    Dispense:  90 capsule    Refill:  5    Patient Instructions  - continue plavix and pravastatin for stroke prevention. - check BP and glucose at home - Follow up with your primary care physician for stroke risk factor modification. Recommend maintain blood pressure goal <130/80, diabetes with hemoglobin A1c goal below 6.5% and lipids with LDL cholesterol goal below 70 mg/dL.  - will start gabapentin 100mg  once at night for 7 days and then 100mg  twice a day for 7 days, and then 100mg  three times a day after - will do carotid ultrasound to evaluate carotid arteries. - follow up in 6 months.    Rosalin Hawking, MD PhD [facility] [address], [place], [place] [address] [phone]

## 2016-01-12 ENCOUNTER — Other Ambulatory Visit: Payer: Self-pay | Admitting: Neurology

## 2016-01-13 NOTE — Telephone Encounter (Signed)
Hi, Katrina:  I prefer his PCP to refill that because he follows with PCP for BP management. Thanks.  Rosalin Hawking, MD PhD Stroke Neurology 01/13/2016 1:09 PM

## 2016-01-22 ENCOUNTER — Other Ambulatory Visit: Payer: Self-pay | Admitting: Neurology

## 2016-01-27 ENCOUNTER — Other Ambulatory Visit: Payer: Self-pay | Admitting: Neurology

## 2016-02-26 DIAGNOSIS — L919 Hypertrophic disorder of the skin, unspecified: Secondary | ICD-10-CM | POA: Diagnosis not present

## 2016-02-26 DIAGNOSIS — L918 Other hypertrophic disorders of the skin: Secondary | ICD-10-CM | POA: Diagnosis not present

## 2016-04-15 ENCOUNTER — Telehealth: Payer: Self-pay | Admitting: Neurology

## 2016-04-15 ENCOUNTER — Encounter: Payer: Self-pay | Admitting: Neurology

## 2016-04-15 ENCOUNTER — Ambulatory Visit (INDEPENDENT_AMBULATORY_CARE_PROVIDER_SITE_OTHER): Payer: Medicare Other | Admitting: Neurology

## 2016-04-15 VITALS — BP 124/74 | HR 61 | Ht 75.0 in | Wt 235.2 lb

## 2016-04-15 DIAGNOSIS — I10 Essential (primary) hypertension: Secondary | ICD-10-CM | POA: Diagnosis not present

## 2016-04-15 DIAGNOSIS — E785 Hyperlipidemia, unspecified: Secondary | ICD-10-CM

## 2016-04-15 DIAGNOSIS — E1159 Type 2 diabetes mellitus with other circulatory complications: Secondary | ICD-10-CM

## 2016-04-15 DIAGNOSIS — I63339 Cerebral infarction due to thrombosis of unspecified posterior cerebral artery: Secondary | ICD-10-CM

## 2016-04-15 DIAGNOSIS — E1142 Type 2 diabetes mellitus with diabetic polyneuropathy: Secondary | ICD-10-CM

## 2016-04-15 MED ORDER — GABAPENTIN 100 MG PO CAPS: 200.0000 mg | ORAL_CAPSULE | Freq: Three times a day (TID) | ORAL | 5 refills | Status: DC

## 2016-04-15 MED ORDER — DULOXETINE HCL 30 MG PO CPEP: 30.0000 mg | ORAL_CAPSULE | Freq: Every day | ORAL | 0 refills | Status: DC

## 2016-04-15 MED ORDER — DULOXETINE HCL 60 MG PO CPEP: 60.0000 mg | ORAL_CAPSULE | Freq: Every day | ORAL | 5 refills | Status: DC

## 2016-04-15 NOTE — Progress Notes (Signed)
STROKE NEUROLOGY FOLLOW UP NOTE  NAME: Casmir Vota Donaghue DOB: [birth year]  REASON FOR VISIT: stroke follow up HISTORY FROM: pt and wife and chart  Today we had the pleasure of seeing Jasiri Puyear Hebel in follow-up at our Neurology Clinic. Pt was accompanied by wife.   History Summary Mr. Saint Welp Karg is a 65 year old male with history of hypertension, diabetes, hyperlipidemia, OSA not on CPAP presented with right-sided numbness on 12/06/14. MRI showed left thalamic stroke, consistent with small vessel disease in left PCA territory. 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 He was put on dural antiplatelet and continued on statin. LDL 65 and A1C 6.6. 2D echo not remarkable but carotid doppler was not done before discharge.   He went back to ER on 12/12/14 due to new onset left sided numbness and mild weakness. Had MRI done again showed right thalamic stroke. His neuro stable and he was sent home from ER.   01/10/15 follow up - the patient has been doing well. No more recurrent symptoms. He still has mild bilateral hands numbness intermittently, but no weakness. His BP 168/89 today, as he run out of his propranolol. His glucose controlled well at home as per pt. He follows with Dr. Justin Mend regularly.  04/16/15 follow up - he was doing well. His ASA has been discontinued after total 3 months of dural antiplatelet. His BP in good control at home and today is 120/68 in clinic. His glucose at home also < 120 most of the time. He still has post stroke fatigue so not doing much exercise every day. He is requesting to limit his Madaline Savage duty for the next 6 months. 30 day cardiac monitoring negative for afib. But CUS and TCD not done yet.  10/17/15 follow up - pt has been doing well from stroke standpoint. However, he continues to have bilateral hand numbness tingling to the point that he has to wear golves today with him due to the discomfort. He also has numbness tingling in bilateral toes but not as bad  as fingers. He has tried on time with 300mg  gabapentin but it made him sleepy. BP at home good, today 123/70. Glucose at 110-120, most recent A1C 6.7. However, CUS has not been done yet.  Interval History During the interval time, pt has been doing well, no stroke symptoms. He continues to have numbness and tingling in feet and hands, with hands more severe than feet. He did not check glucose at home though. BP today 129/74. He is tolerating gabapentin now but wife still think he is pretty sleepy during the day. He refused to try 300mg  at this time but willing to try 200mg  tid. Also agree to try cymbalta at this time. CUS not done yet and said nobody called him for appointment.   REVIEW OF SYSTEMS: Full 14 system review of systems performed and notable only for those listed below and in HPI above, all others are negative:  Constitutional:  Cardiovascular:  Ear/Nose/Throat:   Skin:  Eyes:   Respiratory:   Gastroitestinal:   Genitourinary:  Hematology/Lymphatic:   Endocrine:  Musculoskeletal:  Neck stiffness Allergy/Immunology:   Neurological: numbness Psychiatric:  Sleep:   The following represents the patient's updated allergies and side effects list: Allergies  Allergen Reactions  . Alka-Seltzer Plus Cold [Chlorphen-Phenyleph-Asa] Other (See Comments)    "Knocks me out"    The neurologically relevant items on the patient's problem list were reviewed on today's visit.  Neurologic Examination  A problem  focused neurological exam (12 or more points of the single system neurologic examination, vital signs counts as 1 point, cranial nerves count for 8 points) was performed.  Blood pressure 124/74, pulse 61, height 6\' 3"  (1.905 m), weight 235 lb 3.2 oz (106.7 kg).  General - Well nourished, well developed, in no apparent distress.  Ophthalmologic - Sharp disc margins OU.  Cardiovascular - Regular rate and rhythm with no murmur.  Mental Status -  Level of arousal and orientation  to time, place, and person were intact. Language including expression, naming, repetition, comprehension was assessed and found intact. Fund of Knowledge was assessed and was intact.  Cranial Nerves II - XII - II - Visual field intact OU. III, IV, VI - Extraocular movements intact. V - Facial sensation intact bilaterally. VII - Facial movement intact bilaterally. VIII - Hearing & vestibular intact bilaterally. X - Palate elevates symmetrically. XI - Chin turning & shoulder shrug intact bilaterally. XII - Tongue protrusion intact.  Motor Strength - The patient's strength was normal in all extremities and pronator drift was absent.  Bulk was normal and fasciculations were absent.   Motor Tone - Muscle tone was assessed at the neck and appendages and was normal.  Reflexes - The patient's reflexes were normal in all extremities and he had no pathological reflexes.  Sensory - Light touch, temperature/pinprick were assessed and were symmetrical except bilateral hand decreased light touch and pinprick.    Coordination - The patient had normal movements in the hands and feet with no ataxia or dysmetria.  Tremor was absent.  Gait and Station - The patient's transfers, posture, gait, station, and turns were observed as normal.  Data reviewed: I personally reviewed the images and agree with the radiology interpretations.  2D echo - Left ventricle: The cavity size was normal. Wall thickness was increased in a pattern of mild LVH. Systolic function was normal. The estimated ejection fraction was in the range of 60% to 65%. Wall motion was normal; there were no regional wall motion abnormalities. Doppler parameters are consistent with abnormal left ventricular relaxation (grade 1 diastolic dysfunction). - Pulmonary arteries: Systolic pressure was mildly increased. Impressions: - Normal LV function; mild LVH; grade 1 diastolic dysfunction; trace MR.  MRI and MRA 12/05/14 Small  acute infarct left thalamus. 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 There is atherosclerotic disease in the basilar and posterior cerebral arteries bilaterally.  MRI 12/12/14 1. New acute lacunar infarct in the RIGHT thalamus. No associated mass effect or hemorrhage. 2. Recent left thalamic lacunar infarct with mild/expected evolution. No mass effect or hemorrhage. 3. No other new intracranial abnormality.  30 day cardiac monitoring - no afib  Component     Latest Ref Rng 12/05/2014 12/06/2014  Cholesterol     0 - 200 mg/dL  154  Triglycerides     <150 mg/dL  291 (H)  HDL Cholesterol     >39 mg/dL  31 (L)  Total CHOL/HDL Ratio       5.0  VLDL     0 - 40 mg/dL  58 (H)  LDL (calc)     0 - 99 mg/dL  65  Hemoglobin A1C     4.8 - 5.6 % 6.8 (H)   Mean Plasma Glucose      148   TSH     0.350 - 4.500  uIU/mL 3.212   Vitamin B-12     211 - 911 pg/mL 1330 (H)   Folate      >20.0     Assessment: As you may recall, he is a 65 y.o. Caucasian male with PMH of HTN, HLD, DM, OSA not on CPAP was admitted on 12/05/14 for left thalamic stroke. MRA showed bilateral PCA and right VA atherosclerosis. Again found to have right thalamic stroke on 12/12/14, confirmed with MRI. His bilateral stroke still consistent with several disease of veterbrobasilar atherosclerosis, especially b/l PCA, less likely cardioembolic. Will continue dural antiplatelet. 30 day cardiac event monitoring negative for afib, but CUS has not done yet. He has bilateral fingers and toes numbness and tingling, most likely due to diabetic neuropathy. Tried low dose gabapentin, some effectiveness but not too much, will increase the dose and add cymbalta. He did not check glucose at home and encourage to resume glucose checking.    Plan:  - continue plavix and  pravastatin for stroke prevention. - check BP and glucose at home and record. - Follow up with your primary care physician for stroke risk factor modification. Recommend maintain blood pressure goal <130/80, diabetes with hemoglobin A1c goal below 6.5% and lipids with LDL cholesterol goal below 70 mg/dL. - increase gabapentin to 200mg  3 times a day - will add cymbalta titrating to 60mg  once a day - will check CUS. - follow up in 6 months.  I spent more than 25 minutes of face to face time with the patient. Greater than 50% of time was spent in counseling and coordination of care. We have discussed about management of diabetic neuropathy, medication options and further stroke work up with CUS.   Orders Placed This Encounter  Procedures  . US Carotid Bilateral    Standing Status:   Future    Standing Expiration Date:   06/17/2017    Order Specific Question:   Reason for Exam (SYMPTOM  OR DIAGNOSIS REQUIRED)    Answer:   stroke    Order Specific Question:   Preferred imaging location?    Answer:   Internal    Meds ordered this encounter  Medications  . gabapentin (NEURONTIN) 100 MG capsule    Sig: Take 2 capsules (200 mg total) by mouth 3 (three) times daily. 100mg  Qhs for one week, then 100mg  bid for one week and then 100mg  tid.    Dispense:  180 capsule    Refill:  5  . DULoxetine (CYMBALTA) 30 MG capsule    Sig: Take 1 capsule (30 mg total) by mouth daily.    Dispense:  7 capsule    Refill:  0  . DULoxetine (CYMBALTA) 60 MG capsule    Sig: Take 1 capsule (60 mg total) by mouth daily.    Dispense:  30 capsule    Refill:  5    Patient Instructions  - continue plavix and pravastatin for stroke prevention. - check BP and glucose at home and record. - Follow up with your primary care physician for stroke risk factor modification. Recommend maintain blood pressure goal <130/80, diabetes with hemoglobin A1c goal below 6.5% and lipids with LDL cholesterol goal below 70 mg/dL. -  increase gabapentin to 200mg  3 times a day - will add cymbalta titrating to 60mg  once a day - will check CUS. - follow up in 6 months.   Rosalin Hawking, MD PhD [facility] [address], [place], [place] [address] [phone]

## 2016-04-15 NOTE — Patient Instructions (Signed)
-   continue plavix and pravastatin for stroke prevention. - check BP and glucose at home and record. - Follow up with your primary care physician for stroke risk factor modification. Recommend maintain blood pressure goal <130/80, diabetes with hemoglobin A1c goal below 6.5% and lipids with LDL cholesterol goal below 70 mg/dL. - increase gabapentin to 200mg  3 times a day - will add cymbalta titrating to 60mg  once a day - will check CUS. - follow up in 6 months.

## 2016-04-15 NOTE — Telephone Encounter (Signed)
Noah with [facility] is calling to get clarification on medication gabapentin (NEURONTIN) 100 MG capsule for the patient.

## 2016-04-15 NOTE — Addendum Note (Signed)
Addended by: Rosalin Hawking on: 04/15/2016 01:05 PM   Modules accepted: Orders

## 2016-04-15 NOTE — Telephone Encounter (Signed)
Rn call Olen Cordial at pharmacy that gabapentin order was change to 2 tablets three times a day with 100mg  capsule.

## 2016-04-16 DIAGNOSIS — N183 Chronic kidney disease, stage 3 (moderate): Secondary | ICD-10-CM | POA: Diagnosis not present

## 2016-04-16 DIAGNOSIS — Z6830 Body mass index (BMI) 30.0-30.9, adult: Secondary | ICD-10-CM | POA: Diagnosis not present

## 2016-04-16 DIAGNOSIS — I10 Essential (primary) hypertension: Secondary | ICD-10-CM | POA: Diagnosis not present

## 2016-04-17 ENCOUNTER — Telehealth: Payer: Self-pay | Admitting: Neurology

## 2016-04-17 NOTE — Telephone Encounter (Signed)
[facility] Patient is ready to be scheduled for Doppler. NO PA needed. Thanks Hinton Dyer.

## 2016-04-23 ENCOUNTER — Ambulatory Visit (INDEPENDENT_AMBULATORY_CARE_PROVIDER_SITE_OTHER): Payer: Medicare Other

## 2016-04-23 DIAGNOSIS — I63339 Cerebral infarction due to thrombosis of unspecified posterior cerebral artery: Secondary | ICD-10-CM

## 2016-04-29 ENCOUNTER — Other Ambulatory Visit: Payer: Medicare Other

## 2016-05-13 ENCOUNTER — Telehealth: Payer: Self-pay | Admitting: Neurology

## 2016-05-13 DIAGNOSIS — Z1211 Encounter for screening for malignant neoplasm of colon: Secondary | ICD-10-CM | POA: Diagnosis not present

## 2016-05-13 DIAGNOSIS — E538 Deficiency of other specified B group vitamins: Secondary | ICD-10-CM | POA: Diagnosis not present

## 2016-05-13 DIAGNOSIS — I10 Essential (primary) hypertension: Secondary | ICD-10-CM | POA: Diagnosis not present

## 2016-05-13 DIAGNOSIS — G629 Polyneuropathy, unspecified: Secondary | ICD-10-CM | POA: Diagnosis not present

## 2016-05-13 DIAGNOSIS — F339 Major depressive disorder, recurrent, unspecified: Secondary | ICD-10-CM | POA: Diagnosis not present

## 2016-05-13 DIAGNOSIS — N183 Chronic kidney disease, stage 3 (moderate): Secondary | ICD-10-CM | POA: Diagnosis not present

## 2016-05-13 DIAGNOSIS — Z7984 Long term (current) use of oral hypoglycemic drugs: Secondary | ICD-10-CM | POA: Diagnosis not present

## 2016-05-13 DIAGNOSIS — E1165 Type 2 diabetes mellitus with hyperglycemia: Secondary | ICD-10-CM | POA: Diagnosis not present

## 2016-05-13 DIAGNOSIS — Z125 Encounter for screening for malignant neoplasm of prostate: Secondary | ICD-10-CM | POA: Diagnosis not present

## 2016-05-13 DIAGNOSIS — E785 Hyperlipidemia, unspecified: Secondary | ICD-10-CM | POA: Diagnosis not present

## 2016-05-13 DIAGNOSIS — E1121 Type 2 diabetes mellitus with diabetic nephropathy: Secondary | ICD-10-CM | POA: Diagnosis not present

## 2016-05-13 DIAGNOSIS — Z0000 Encounter for general adult medical examination without abnormal findings: Secondary | ICD-10-CM | POA: Diagnosis not present

## 2016-05-13 NOTE — Telephone Encounter (Signed)
LFt vm for patient to call back about results.

## 2016-05-13 NOTE — Telephone Encounter (Signed)
Could you please let the patient know that the carotid ultrasound test done recently in our office was negative for carotid artery narrowing. Please continue current treatment. Thanks.  Rosalin Hawking, MD PhD Stroke Neurology 05/13/2016 12:40 PM

## 2016-05-14 ENCOUNTER — Telehealth: Payer: Self-pay | Admitting: Neurology

## 2016-05-14 NOTE — Telephone Encounter (Signed)
LFt vm for patient to call back about carotid results.

## 2016-05-14 NOTE — Telephone Encounter (Signed)
Rn call patient back to give carotid results.Rn stated per Dr. Erlinda Hong the carotid ultrasound test done recently was negative for carotid artery narrowing. Rn stated to continue treatment plan.. Pt verbalized understanding.

## 2016-05-14 NOTE — Telephone Encounter (Signed)
Pt wife returned your call about results please call back dg

## 2016-06-16 DIAGNOSIS — Z1211 Encounter for screening for malignant neoplasm of colon: Secondary | ICD-10-CM | POA: Diagnosis not present

## 2016-08-26 DIAGNOSIS — E1121 Type 2 diabetes mellitus with diabetic nephropathy: Secondary | ICD-10-CM | POA: Diagnosis not present

## 2016-09-03 ENCOUNTER — Encounter: Payer: Self-pay | Admitting: Neurology

## 2016-10-16 ENCOUNTER — Ambulatory Visit: Payer: Medicare Other | Admitting: Neurology

## 2016-10-20 DIAGNOSIS — E119 Type 2 diabetes mellitus without complications: Secondary | ICD-10-CM | POA: Diagnosis not present

## 2016-10-20 DIAGNOSIS — I10 Essential (primary) hypertension: Secondary | ICD-10-CM | POA: Diagnosis not present

## 2016-10-20 DIAGNOSIS — N183 Chronic kidney disease, stage 3 (moderate): Secondary | ICD-10-CM | POA: Diagnosis not present

## 2016-10-27 ENCOUNTER — Ambulatory Visit (INDEPENDENT_AMBULATORY_CARE_PROVIDER_SITE_OTHER): Payer: Medicare Other | Admitting: Neurology

## 2016-10-27 ENCOUNTER — Encounter: Payer: Self-pay | Admitting: Neurology

## 2016-10-27 VITALS — BP 120/72 | HR 60 | Wt 234.0 lb

## 2016-10-27 DIAGNOSIS — E785 Hyperlipidemia, unspecified: Secondary | ICD-10-CM | POA: Diagnosis not present

## 2016-10-27 DIAGNOSIS — E1142 Type 2 diabetes mellitus with diabetic polyneuropathy: Secondary | ICD-10-CM | POA: Diagnosis not present

## 2016-10-27 DIAGNOSIS — I10 Essential (primary) hypertension: Secondary | ICD-10-CM | POA: Diagnosis not present

## 2016-10-27 DIAGNOSIS — I63332 Cerebral infarction due to thrombosis of left posterior cerebral artery: Secondary | ICD-10-CM

## 2016-10-27 NOTE — Progress Notes (Signed)
STROKE NEUROLOGY FOLLOW UP NOTE  NAME: David Glass DOB: 06-11-1951  REASON FOR VISIT: stroke follow up HISTORY FROM: pt and wife and chart  Today we had the pleasure of seeing David Glass in follow-up at our Neurology Clinic. Pt was accompanied by wife.   History Summary David Glass is a 66 year old male with history of hypertension, diabetes, hyperlipidemia, OSA not on CPAP presented with right-sided numbness on 12/06/14. MRI showed left thalamic stroke, consistent with small vessel disease in left PCA territory. 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 David Glass was put on dural antiplatelet and continued on statin. LDL 65 and A1C 6.6. 2D echo not remarkable but carotid doppler was not done before discharge.   David Glass went back to ER on 12/12/14 due to new onset left sided numbness and mild weakness. Had MRI done again showed right thalamic stroke. His neuro stable and David Glass was sent home from ER.   01/10/15 follow up - the patient has been doing well. No more recurrent symptoms. David Glass still has mild bilateral hands numbness intermittently, but no weakness. His BP 168/89 today, as David Glass run out of his propranolol. His glucose controlled well at home as per pt. David Glass follows with Dr. Justin Mend regularly.  04/16/15 follow up - David Glass was doing well. His ASA has been discontinued after total 3 months of dural antiplatelet. His BP in good control at home and today is 120/68 in clinic. His glucose at home also < 120 most of the time. David Glass still has post stroke fatigue so not doing much exercise every day. David Glass is requesting to limit his Madaline Savage duty for the next 6 months. 30 day cardiac monitoring negative for afib. But CUS and TCD not done yet.  10/17/15 follow up - pt has been doing well from stroke standpoint. However, David Glass continues to have bilateral hand numbness tingling to the point that David Glass has to wear golves today with him due to the discomfort. David Glass also has numbness tingling in bilateral toes but not as bad  as fingers. David Glass has tried on time with 300mg  gabapentin but it made him sleepy. BP at home good, today 123/70. Glucose at 110-120, most recent A1C 6.7. However, CUS has not been done yet.  04/15/16 follow up - pt has been doing well, no stroke symptoms. David Glass continues to have numbness and tingling in feet and hands, with hands more severe than feet. David Glass did not check glucose at home though. BP today 129/74. David Glass is tolerating gabapentin now but wife still think David Glass is pretty sleepy during the day. David Glass refused to try 300mg  at this time but willing to try 200mg  tid. Also agree to try cymbalta at this time. CUS not done yet and said nobody called him for appointment.  Interval History During the interval time, pt has been doing the same. David Glass continues to have numbness and tingling in feet and hands, with hands 10 times more severe than feet. David Glass is on gabapentin 400 mg twice a day and Cymbalta 60 mg once a day. However, they are minimally effective. David Glass had been on Lyrica before, not effective. His nephrologist against further increase gabapentin dose due to CKD. BP today 120/72, however glucose not eating good control, recent A1c 7.8.   REVIEW OF SYSTEMS: Full 14 system review of systems performed and notable only for those listed below and in HPI above, all others are negative:  Constitutional:  Cardiovascular:  Ear/Nose/Throat:   Skin:  Eyes:  Itching Respiratory:   Gastroitestinal:   Genitourinary:  Hematology/Lymphatic:   Endocrine:  Musculoskeletal:  Neck stiffness, neck pain Allergy/Immunology:   Neurological: numbness, dizziness Psychiatric:  Sleep: Sleep talking, sleep walking  The following represents the patient's updated allergies and side effects list: Allergies  Allergen Reactions  . Alka-Seltzer Plus Cold [Chlorphen-Phenyleph-Asa] Other (See Comments)    "Knocks me out"    The neurologically relevant items on the patient's problem list were reviewed on today's visit.  Neurologic  Examination  A problem focused neurological exam (12 or more points of the single system neurologic examination, vital signs counts as 1 point, cranial nerves count for 8 points) was performed.  Blood pressure 120/72, pulse 60, weight 234 lb (106.1 kg).  General - Well nourished, well developed, in no apparent distress.  Ophthalmologic - Sharp disc margins OU.  Cardiovascular - Regular rate and rhythm with no murmur.  Mental Status -  Level of arousal and orientation to time, place, and person were intact. Language including expression, naming, repetition, comprehension was assessed and found intact. Fund of Knowledge was assessed and was intact.  Cranial Nerves II - XII - II - Visual field intact OU. III, IV, VI - Extraocular movements intact. V - Facial sensation intact bilaterally. VII - Facial movement intact bilaterally. VIII - Hearing & vestibular intact bilaterally. X - Palate elevates symmetrically. XI - Chin turning & shoulder shrug intact bilaterally. XII - Tongue protrusion intact.  Motor Strength - The patient's strength was normal in all extremities and pronator drift was absent.  Bulk was normal and fasciculations were absent.   Motor Tone - Muscle tone was assessed at the neck and appendages and was normal.  Reflexes - The patient's reflexes were normal in all extremities and David Glass had no pathological reflexes.  Sensory - Light touch, temperature/pinprick were assessed and were symmetrical except bilateral hand decreased light touch and pinprick.    Coordination - The patient had normal movements in the hands and feet with no ataxia or dysmetria.  Tremor was absent.  Gait and Station - The patient's transfers, posture, gait, station, and turns were observed as normal.  Data reviewed: I personally reviewed the images and agree with the radiology interpretations.  2D echo - Left ventricle: The cavity size was normal. Wall thickness was increased in a pattern of  mild LVH. Systolic function was normal. The estimated ejection fraction was in the range of 60% to 65%. Wall motion was normal; there were no regional wall motion abnormalities. Doppler parameters are consistent with abnormal left ventricular relaxation (grade 1 diastolic dysfunction). - Pulmonary arteries: Systolic pressure was mildly increased. Impressions: - Normal LV function; mild LVH; grade 1 diastolic dysfunction; trace MR.  MRI and MRA 12/05/14 Small acute infarct left thalamus. 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 There is atherosclerotic disease in the basilar and posterior cerebral arteries bilaterally.  MRI 12/12/14 1. New acute lacunar infarct in the RIGHT thalamus. No associated mass effect or hemorrhage. 2. Recent left thalamic lacunar infarct with mild/expected evolution. No mass effect or hemorrhage. 3. No other new intracranial abnormality.  30 day cardiac monitoring - no afib  Component     Latest Ref Rng 12/05/2014 12/06/2014  Cholesterol     0 - 200 mg/dL  154  Triglycerides     <150 mg/dL  291 (H)  HDL Cholesterol     >  39 mg/dL  31 (L)  Total CHOL/HDL Ratio       5.0  VLDL     0 - 40 mg/dL  58 (H)  LDL (calc)     0 - 99 mg/dL  65  Hemoglobin A1C     4.8 - 5.6 % 6.8 (H)   Mean Plasma Glucose      148   TSH     0.350 - 4.500 uIU/mL 3.212   Vitamin B-12     211 - 911 pg/mL 1330 (H)   Folate      >20.0     Assessment: As you may recall, David Glass is a 66 y.o. Caucasian male with PMH of HTN, HLD, DM, OSA not on CPAP was admitted on 12/05/14 for left thalamic stroke. MRA showed bilateral PCA and right VA atherosclerosis. Again found to have right thalamic stroke on 12/12/14, confirmed with MRI. His bilateral stroke still consistent with several disease of veterbrobasilar  atherosclerosis, especially b/l PCA, less likely cardioembolic. Will continue dural antiplatelet. 30 day cardiac event monitoring negative for afib, but CUS has not done yet. David Glass has bilateral fingers and toes numbness and tingling, most likely due to diabetic neuropathy. Currently on gabapentin 400 mg twice a day and cymbalta 60 mg once a day, minimally effective. Has tried Lyrica in the past not effective. BP better control, however glucose not in good control, A1c 7.8.   Plan:  - continue plavix and pravastatin for stroke prevention. - check BP and glucose at home and record. - Follow up with your primary care physician for stroke risk factor modification. Recommend maintain blood pressure goal <130/80, diabetes with hemoglobin A1c goal below 6.5% and lipids with LDL cholesterol goal below 70 mg/dL. - continue gabapentin 400mg  2 times a day - continue cymbalta 60mg  once a day. - Recommend EMG/NCS study, however patient not ready yet. David Glass would let us know when David Glass is ready - consider topic creams like lidocaine cream for local use in hands. - follow up in 6 months.  I spent more than 25 minutes of face to face time with the patient. Greater than 50% of time was spent in counseling and coordination of care. We have discussed about management of diabetic neuropathy, medication options and consider EMG/NCS.   No orders of the defined types were placed in this encounter.   No orders of the defined types were placed in this encounter.   Patient Instructions  - continue plavix and pravastatin for stroke prevention. - check BP and glucose at home and record. - Follow up with your primary care physician for stroke risk factor modification. Recommend maintain blood pressure goal <130/80, diabetes with hemoglobin A1c goal below 6.5% and lipids with LDL cholesterol goal below 70 mg/dL. - continue gabapentin 400mg  2 times a day - continue cymbalta 60mg  once a day. - let us know when you are ready for  nerve conduction study - consider topic creams like lidocaine cream for local use in hands. - follow up in 6 months.   Rosalin Hawking, MD PhD [facility] [address] [place], [place] [address] [phone]

## 2016-10-27 NOTE — Patient Instructions (Signed)
-   continue plavix and pravastatin for stroke prevention. - check BP and glucose at home and record. - Follow up with your primary care physician for stroke risk factor modification. Recommend maintain blood pressure goal <130/80, diabetes with hemoglobin A1c goal below 6.5% and lipids with LDL cholesterol goal below 70 mg/dL. - continue gabapentin 400mg  2 times a day - continue cymbalta 60mg  once a day. - let us know when you are ready for nerve conduction study - consider topic creams like lidocaine cream for local use in hands. - follow up in 6 months.

## 2016-11-21 ENCOUNTER — Other Ambulatory Visit: Payer: Self-pay | Admitting: Neurology

## 2016-11-21 DIAGNOSIS — E1142 Type 2 diabetes mellitus with diabetic polyneuropathy: Secondary | ICD-10-CM

## 2016-11-23 ENCOUNTER — Other Ambulatory Visit: Payer: Self-pay

## 2016-11-23 DIAGNOSIS — E1142 Type 2 diabetes mellitus with diabetic polyneuropathy: Secondary | ICD-10-CM

## 2016-11-23 MED ORDER — GABAPENTIN 100 MG PO CAPS: 200.0000 mg | ORAL_CAPSULE | Freq: Three times a day (TID) | ORAL | 2 refills | Status: DC

## 2016-11-30 IMAGING — MR MR MRA HEAD W/O CM
9 of 11 series · 30 of 48 positions shown · non-contrast
Comparison: CT head 12/05/2014

CLINICAL DATA: Tingling sensation right arm and leg

EXAM:
MRI HEAD WITHOUT CONTRAST
MRA HEAD WITHOUT CONTRAST
TECHNIQUE: Multiplanar, multiecho pulse sequences of the brain and surrounding
structures were obtained without intravenous contrast. Angiographic
images of the head were obtained using MRA technique without
contrast.

[Series 3: DWI · axial · 3.0mm · 0.94mm/px · z∈[-72,+75]mm · 6 of 100 slices shown (1 of 4)]
[im 1/100]
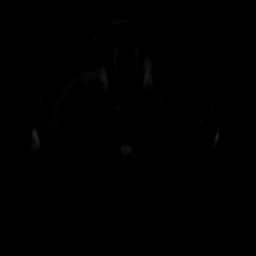
[im 20/100]
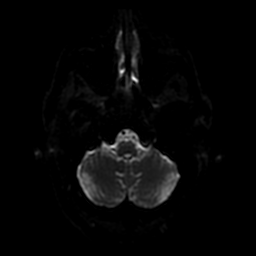
[im 40/100]
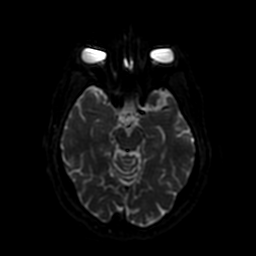
[im 60/100]
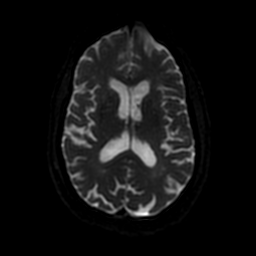
[im 80/100]
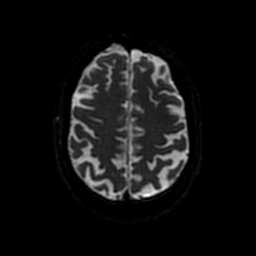
[im 100/100]
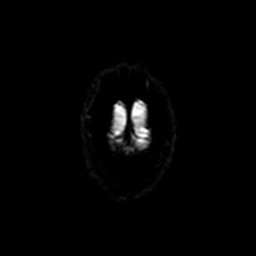

[Series 4: FLAIR · sagittal · 5.0mm · 0.47mm/px · 2 of 23 slices shown (1 of 2)]
[im 1/23]
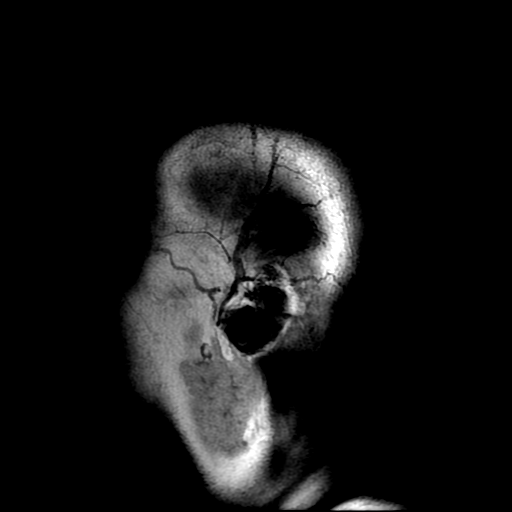
[im 23/23]
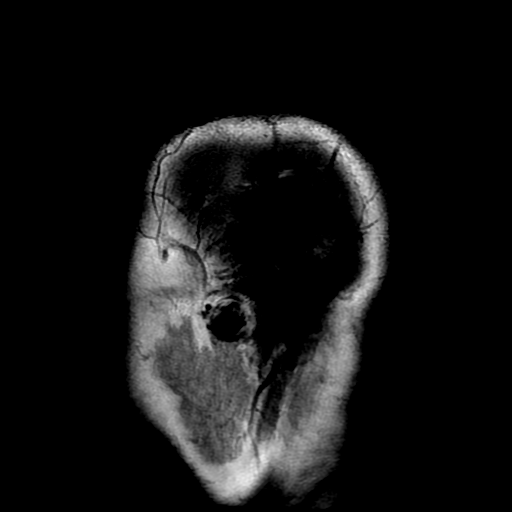

[Series 5: ax (id) 2 · axial · 1.2mm · 0.43mm/px · z∈[-84,-5]mm · 6 of 200 slices shown]
[im 1/200]
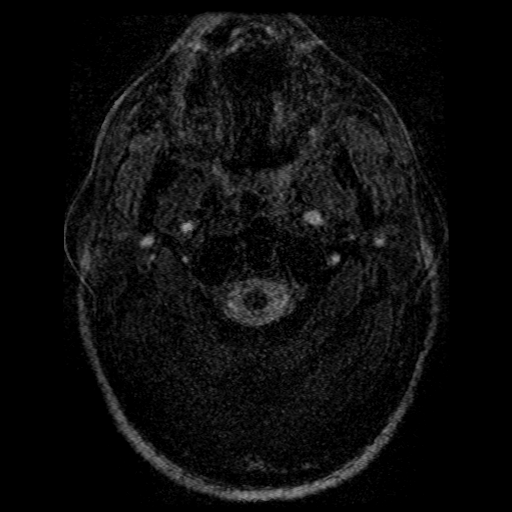
[im 34/200]
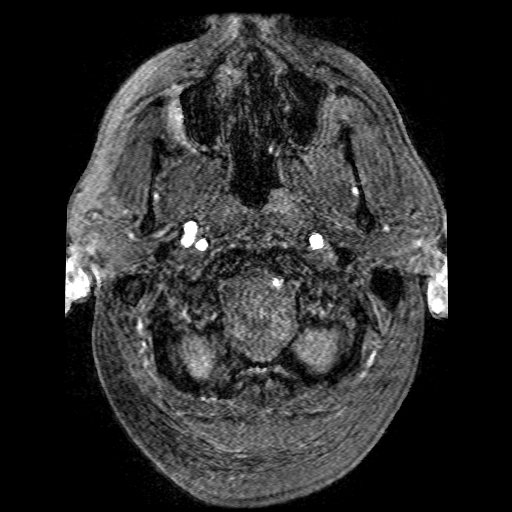
[im 67/200]
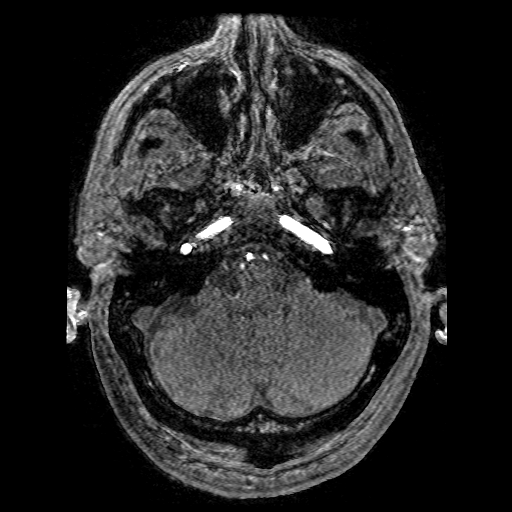
[im 83/200]
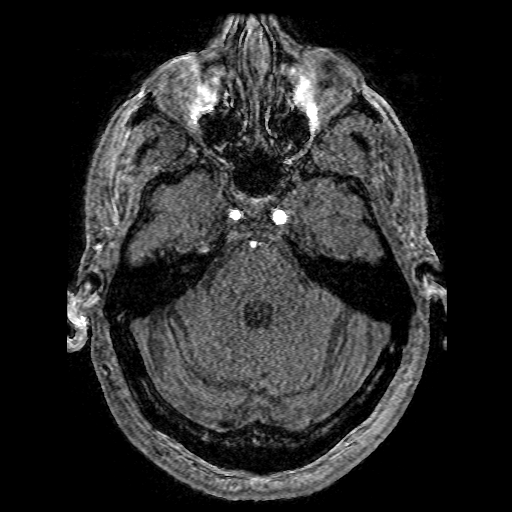
[im 117/200]
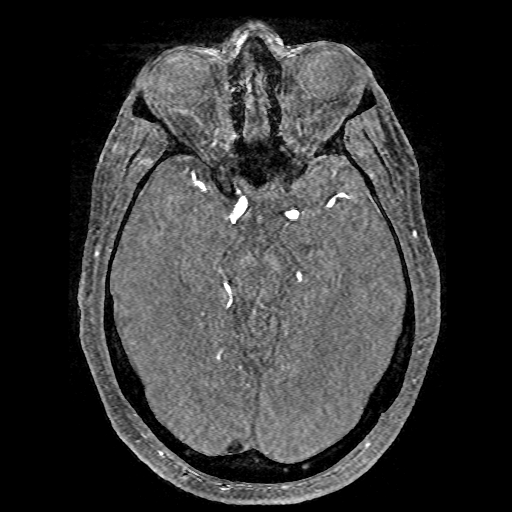
[im 133/200]
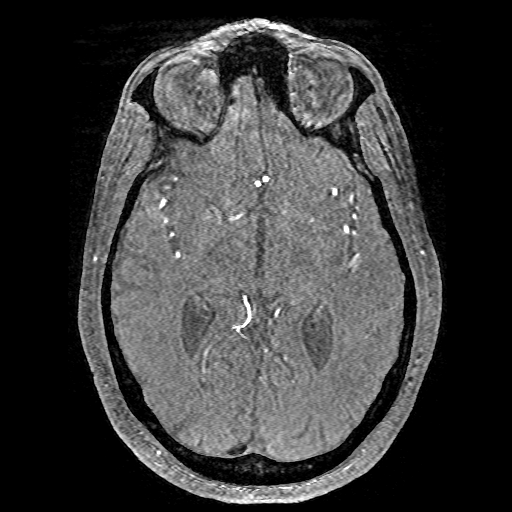

[Series 6: T2 · axial · 5.0mm · 0.47mm/px · z∈[-64,+73]mm · 2 of 24 slices shown (1 of 2)]
[im 1/24]
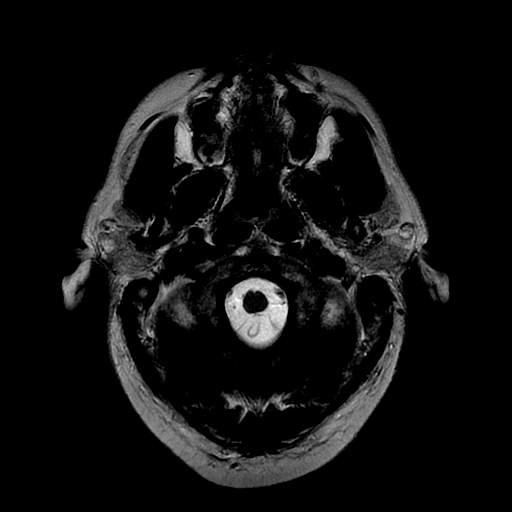
[im 24/24]
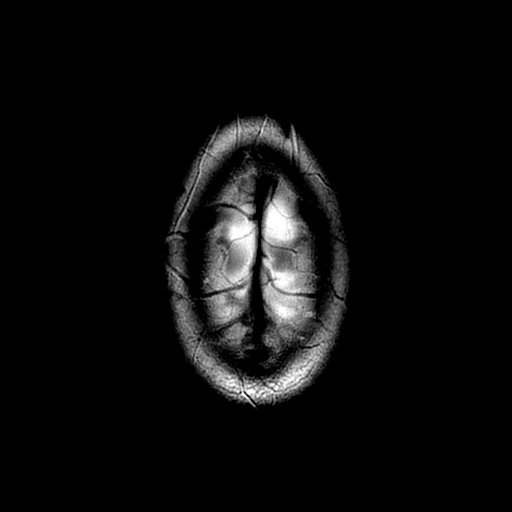

[Series 7: FLAIR · axial · 5.0mm · 0.47mm/px · z∈[-64,+73]mm · 2 of 24 slices shown (2 of 2)]
[im 1/24]
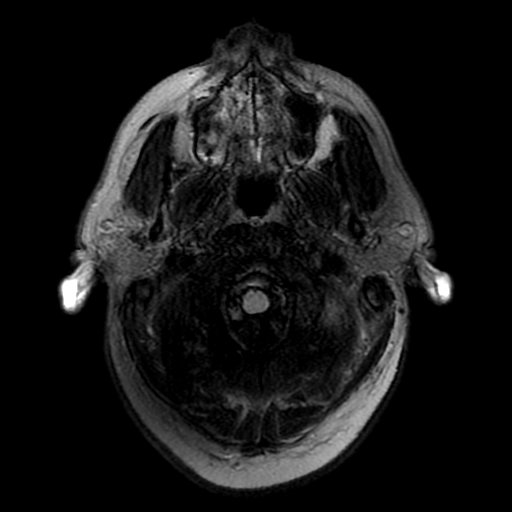
[im 24/24]
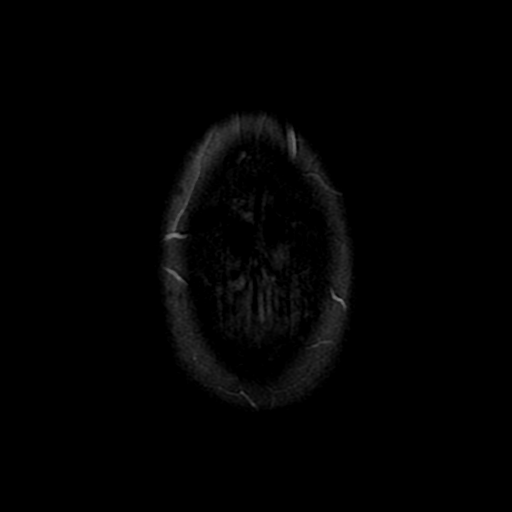

[Series 8: DWI · coronal · 5.0mm · 0.94mm/px · 5 of 68 slices shown (2 of 4)]
[im 1/68]
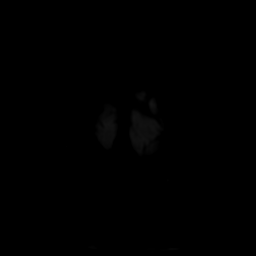
[im 17/68]
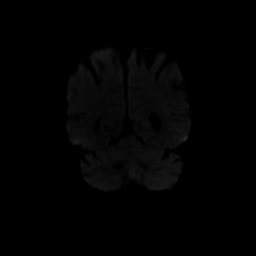
[im 34/68]
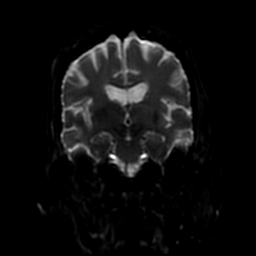
[im 51/68]
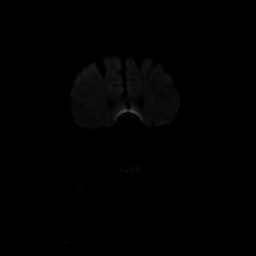
[im 68/68]
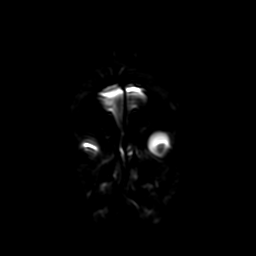

[Series 11: T2 · coronal · 5.0mm · 0.43mm/px · 2 of 28 slices shown (2 of 2)]
[im 1/28]
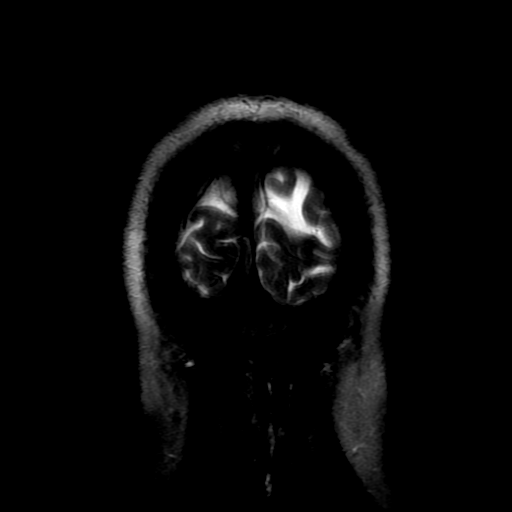
[im 28/28]
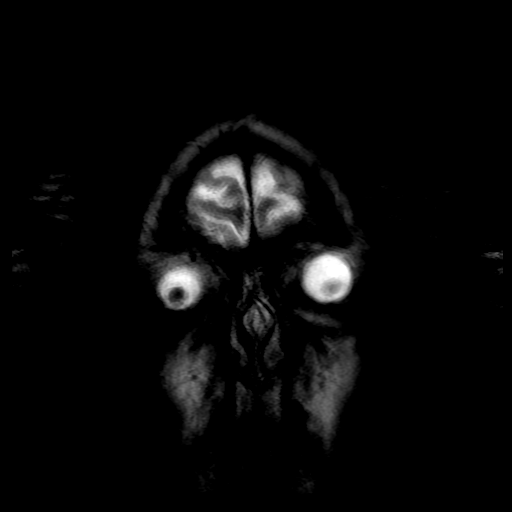

[Series 300: DWI · axial · 3.0mm · 0.94mm/px · z∈[-72,+75]mm · 3 of 50 slices shown (3 of 4)]
[im 1/50]
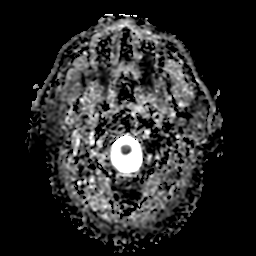
[im 25/50]
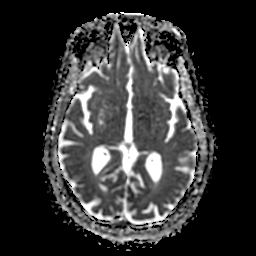
[im 50/50]
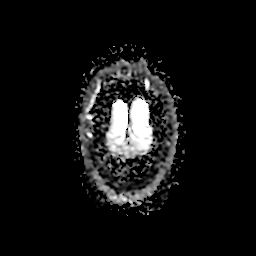

[Series 800: DWI · coronal · 5.0mm · 0.94mm/px · 2 of 34 slices shown (4 of 4)]
[im 1/34]
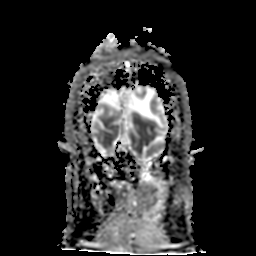
[im 34/34]
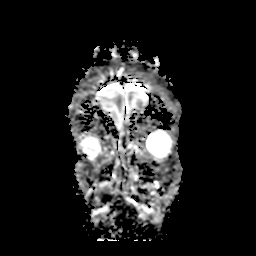

[30 of 48 positions shown; findings below may reference images not displayed]

FINDINGS: MRI HEAD FINDINGS

Acute infarct left thalamus measuring approximately 6 mm. This is
located in the central thalamus. No other acute infarct.

Mild atrophy. Mild chronic microvascular ischemic change in the
white matter. Brainstem and cerebellum intact

Negative for intracranial hemorrhage.

Negative for mass or edema

Paranasal sinuses are clear.

MRA HEAD FINDINGS

Occlusion distal right vertebral artery at the level the dura.
Occlusion is at the level of PICA. This appears to be an abrupt
occlusion and could be a cause of emboli. This could be due to
atherosclerotic disease or dissection. Left vertebral artery widely
patent. AICA patent bilaterally. Basilar shows mild atherosclerotic
disease in the midportion. Fetal origin of the posterior cerebral
artery bilaterally with hypoplastic distal basilar. Superior
cerebellar arteries are patent bilaterally. Atherosclerotic
irregularity and moderate stenosis in the posterior cerebral artery
bilaterally

Internal carotid artery widely patent bilaterally. Anterior and
middle cerebral arteries widely patent without significant stenosis

Negative for cerebral aneurysm
IMPRESSION: Small acute infarct left thalamus.

Mild chronic microvascular ischemic change in the white matter

Significant vertebrobasilar disease. Abrupt occlusion distal right
vertebral artery at the level of PICA which may be due to dissection
or thrombosis. There is atherosclerotic disease in the basilar and
posterior cerebral arteries bilaterally.

## 2016-12-18 ENCOUNTER — Other Ambulatory Visit: Payer: Self-pay | Admitting: Neurology

## 2016-12-18 DIAGNOSIS — E1142 Type 2 diabetes mellitus with diabetic polyneuropathy: Secondary | ICD-10-CM

## 2016-12-23 DIAGNOSIS — E1121 Type 2 diabetes mellitus with diabetic nephropathy: Secondary | ICD-10-CM | POA: Diagnosis not present

## 2017-01-13 DIAGNOSIS — E1121 Type 2 diabetes mellitus with diabetic nephropathy: Secondary | ICD-10-CM | POA: Diagnosis not present

## 2017-01-13 DIAGNOSIS — Z794 Long term (current) use of insulin: Secondary | ICD-10-CM | POA: Diagnosis not present

## 2017-01-14 DIAGNOSIS — L820 Inflamed seborrheic keratosis: Secondary | ICD-10-CM | POA: Diagnosis not present

## 2017-01-14 DIAGNOSIS — L821 Other seborrheic keratosis: Secondary | ICD-10-CM | POA: Diagnosis not present

## 2017-01-18 ENCOUNTER — Encounter: Payer: Self-pay | Admitting: Nurse Practitioner

## 2017-01-22 DIAGNOSIS — Z7984 Long term (current) use of oral hypoglycemic drugs: Secondary | ICD-10-CM | POA: Diagnosis not present

## 2017-01-22 DIAGNOSIS — E1121 Type 2 diabetes mellitus with diabetic nephropathy: Secondary | ICD-10-CM | POA: Diagnosis not present

## 2017-01-27 DIAGNOSIS — E1121 Type 2 diabetes mellitus with diabetic nephropathy: Secondary | ICD-10-CM | POA: Diagnosis not present

## 2017-01-27 DIAGNOSIS — Z7984 Long term (current) use of oral hypoglycemic drugs: Secondary | ICD-10-CM | POA: Diagnosis not present

## 2017-01-30 ENCOUNTER — Other Ambulatory Visit: Payer: Self-pay | Admitting: Neurology

## 2017-04-22 ENCOUNTER — Other Ambulatory Visit: Payer: Self-pay | Admitting: Neurology

## 2017-04-22 DIAGNOSIS — E1142 Type 2 diabetes mellitus with diabetic polyneuropathy: Secondary | ICD-10-CM

## 2017-04-26 ENCOUNTER — Ambulatory Visit: Payer: Medicare Other | Admitting: Nurse Practitioner

## 2017-05-11 NOTE — Progress Notes (Signed)
[facility]  PATIENT: David Glass DOB: 05-17-1951   REASON FOR VISIT: Follow-up for history of stroke, or any paresthesias in the fingers and feet HISTORY FROM: Patient and wife    HISTORY OF PRESENT ILLNESS:David Glass is a 66 year old male with history of hypertension, diabetes, hyperlipidemia, OSA not on CPAP presented with right-sided numbness on 12/06/14. MRI showed left thalamic stroke, consistent with small vessel disease in left PCA territory. 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 He was put on dural antiplatelet and continued on statin. LDL 65 and A1C 6.6. 2D echo not remarkable but carotid doppler was not done before discharge.   He went back to ER on 12/12/14 due to new onset left sided numbness and mild weakness. Had MRI done again showed right thalamic stroke. His neuro stable and he was sent home from ER.   01/10/15 follow up - the patient has been doing well. No more recurrent symptoms. He still has mild bilateral hands numbness intermittently, but no weakness. His BP 168/89 today, as he run out of his propranolol. His glucose controlled well at home as per pt. He follows with Dr. Justin Mend regularly.  04/16/15 follow up - he was doing well. His ASA has been discontinued after total 3 months of dural antiplatelet. His BP in good control at home and today is 120/68 in clinic. His glucose at home also < 120 most of the time. He still has post stroke fatigue so not doing much exercise every day. He is requesting to limit his Madaline Savage duty for the next 6 months. 30 day cardiac monitoring negative for afib. But CUS and TCD not done yet.  10/17/15 follow up - pt has been doing well from stroke standpoint. However, he continues to have bilateral hand numbness tingling to the point that he has to wear golves today with him due to the discomfort. He also has numbness tingling in bilateral toes but not as bad as fingers. He has tried on time with  300mg  gabapentin but it made him sleepy. BP at home good, today 123/70. Glucose at 110-120, most recent A1C 6.7. However, CUS has not been done yet.  04/15/16 follow up - pt has been doing well, no stroke symptoms. He continues to have numbness and tingling in feet and hands, with hands more severe than feet. He did not check glucose at home though. BP today 129/74. He is tolerating gabapentin now but wife still think he is pretty sleepy during the day. He refused to try 300mg  at this time but willing to try 200mg  tid. Also agree to try cymbalta at this time. CUS not done yet and said nobody called him for appointment.  Interval History2/6/18 David Glass During the interval time, pt has been doing the same. He continues to have numbness and tingling in feet and hands, with hands 10 times more severe than feet. He is on gabapentin 400 mg twice a day and Cymbalta 60 mg once a day. However, they are minimally effective. He had been on Lyrica before, not effective. His nephrologist against further increase gabapentin dose due to CKD. BP today 120/72, however glucose not eating good control, recent A1c 7.8.  UPDATE 08/22/2018CM David Glass, 66 year old male returns for follow-up with history of stroke in March 2016. He continues to do well from a stroke standpoint. He is currently on Plavix for secondary stroke prevention without further stroke or TIA symptoms. His diabetes remains out of control and his CBGs  average around 200-300 her wife. He continues to have numbness tingling in the feet and hands with hands worse at the feet. He claims this has been since stroke he has not had EMG nerve conduction done. He has been on Lyrica before which was not effective. He has stage III kidney disease and his nephrologist is against further increases in his gabapentin. He is currently on 400 twice a day. He has stopped Cymbalta 60 mg daily because he says it is not effective. He denies any falls. He returns for  reevaluation  REVIEW OF SYSTEMS: Full 14 system review of systems performed and notable only for those listed, all others are neg:  Constitutional: neg  Cardiovascular: neg Ear/Nose/Throat: neg  Skin: neg Eyes: neg Respiratory: neg Gastroitestinal: neg  Hematology/Lymphatic: neg  Endocrine: neg Musculoskeletal:neg Allergy/Immunology: neg Neurological: Numbness in fingers and toes Psychiatric: neg Sleep : neg   ALLERGIES: Allergies  Allergen Reactions  . Alka-Seltzer Plus Cold [Chlorphen-Phenyleph-Asa] Other (See Comments)    "Knocks me out"    HOME MEDICATIONS: Outpatient Medications Prior to Visit  Medication Sig Dispense Refill  . amLODipine (NORVASC) 10 MG tablet TAKE 1 TABLET BY MOUTH EVERY DAY 90 tablet 3  . clopidogrel (PLAVIX) 75 MG tablet Take 1 tablet (75 mg total) by mouth daily. 30 tablet 2  . gabapentin (NEURONTIN) 100 MG capsule Take 2 capsules (200 mg total) by mouth 3 (three) times daily. (Patient taking differently: Take 400 mg by mouth. ) 180 capsule 2  . glipiZIDE (GLUCOTROL) 5 MG tablet Take 0.5 tablets (2.5 mg total) by mouth 2 (two) times daily before a meal. (Patient taking differently: Take 10 mg by mouth 2 (two) times daily before a meal. ) 60 tablet 0  . lisinopril (PRINIVIL,ZESTRIL) 5 MG tablet Take 5 mg by mouth at bedtime.  12  . pravastatin (PRAVACHOL) 20 MG tablet Take 20 mg by mouth daily.  11  . propranolol (INDERAL) 80 MG tablet Take 80 mg by mouth 3 (three) times daily.    . DULoxetine (CYMBALTA) 60 MG capsule TAKE ONE CAPSULE BY MOUTH EVERY DAY 30 capsule 5  . gabapentin (NEURONTIN) 100 MG capsule TAKE 2 CAPSULES BY MOUTH 3 TIMES A DAY 180 capsule 6   No facility-administered medications prior to visit.     PAST MEDICAL HISTORY: Past Medical History:  Diagnosis Date  . B12 deficiency 12/05/2014  . CKD stage 3 due to type 2 diabetes mellitus ([place]) 12/05/2014  . Diabetes mellitus without complication ([place])   . High cholesterol   .  Hypertension   . Neuropathy, peripheral 12/05/2014  . Stroke [facility])     PAST SURGICAL HISTORY: History reviewed. No pertinent surgical history.  FAMILY HISTORY: Family History  Problem Relation Age of Onset  . Hyperlipidemia Mother   . Hypertension Mother   . Hypertension Father   . Hyperlipidemia Father   . Stroke Brother     SOCIAL HISTORY: Social History   Social History  . Marital status: Married    Spouse name: N/A  . Number of children: N/A  . Years of education: N/A   Occupational History  . Not on file.   Social History Main Topics  . Smoking status: Never Smoker  . Smokeless tobacco: Never Used  . Alcohol use 0.6 oz/week    1 Glasses of wine per week     Comment: occasional  . Drug use: No  . Sexual activity: Not on file   Other Topics Concern  . Not on  file   Social History Narrative  . No narrative on file     PHYSICAL EXAM  Vitals:   05/12/17 1428  BP: 117/71  Pulse: (!) 59  Weight: 234 lb 9.6 oz (106.4 kg)  Height: 6\' 3"  (1.905 m)   Body mass index is 29.32 kg/m.  Generalized: Well developed, in no acute distress  Head: normocephalic and atraumatic,. Oropharynx benign  Neck: Supple, no carotid bruits  Cardiac: Regular rate rhythm, no murmur  Musculoskeletal: No deformity   Neurological examination   Mentation: Alert oriented to time, place, history taking. Attention span and concentration appropriate. Recent and remote memory intact.  Follows all commands speech and language fluent.   Cranial nerve II-XII: Fundoscopic exam reveals sharp disc margins.Pupils were equal round reactive to light extraocular movements were full, visual field were full on confrontational test. Facial sensation and strength were normal. hearing was intact to finger rubbing bilaterally. Uvula tongue midline. head turning and shoulder shrug were normal and symmetric.Tongue protrusion into cheek strength was normal. Motor: normal bulk and tone, full strength in  the BUE, BLE, fine finger movements normal, no pronator drift. No focal weakness Sensory: Light touch, temperature/pinprick were assessed and were symmetrical except bilateral hand decreased light touch and pinprick. Coordination: finger-nose-finger, heel-to-shin bilaterally, no dysmetria,No tremor Reflexes: Symmetric upper and lower, plantar responses were flexor bilaterally. Gait and Station: Rising up from seated position without assistance, normal stance,  moderate stride, good arm swing, smooth turning, able to perform tiptoe, and heel walking without difficulty. Tandem gait is unsteady  DIAGNOSTIC DATA (LABS, IMAGING, TESTING) - I reviewed patient records, labs, notes, testing and imaging myself where available.  Lab Results  Component Value Date   WBC 10.3 12/06/2014   HGB 12.8 (L) 12/06/2014   HCT 38.3 (L) 12/06/2014   MCV 91.0 12/06/2014   PLT 204 12/06/2014      Component Value Date/Time   NA 146 (H) 12/06/2014 0525   K 3.7 12/06/2014 0525   CL 113 (H) 12/06/2014 0525   CO2 20 12/06/2014 0525   GLUCOSE 169 (H) 12/06/2014 0525   BUN 22 12/06/2014 0525   CREATININE 1.77 (H) 12/06/2014 0525   CALCIUM 9.7 12/06/2014 0525   PROT 6.7 12/05/2014 1140   ALBUMIN 4.0 12/05/2014 1140   AST 20 12/05/2014 1140   ALT 25 12/05/2014 1140   ALKPHOS 81 12/05/2014 1140   BILITOT 0.7 12/05/2014 1140   GFRNONAA 38 (L) 12/06/2014 0525   GFRAA 44 (L) 12/06/2014 0525   Lab Results  Component Value Date   CHOL 154 12/06/2014   HDL 31 (L) 12/06/2014   LDLCALC 65 12/06/2014   TRIG 291 (H) 12/06/2014   CHOLHDL 5.0 12/06/2014   Lab Results  Component Value Date   HGBA1C 6.8 (H) 12/05/2014   Lab Results  Component Value Date   VITAMINB12 1,330 (H) 12/05/2014   Lab Results  Component Value Date   TSH 3.212 12/05/2014      ASSESSMENT AND PLAN 66 y.o. Caucasian male with PMH of HTN, HLD, DM, OSA not on CPAP was admitted on 12/05/14 for left thalamic stroke. MRA showed bilateral PCA  and right VA atherosclerosis. Again found to have right thalamic stroke on 12/12/14, confirmed with MRI. His bilateral stroke still consistent with several disease of veterbrobasilar atherosclerosis, especially b/l PCA, less likely cardioembolic. Cardiac event monitoring negative for afib.  He has bilateral fingers and toes numbness and tingling, most likely due to diabetic neuropathy. Currently on gabapentin 400 mg twice  a day. Has tried Lyrica and Cymbalta in the past not effective. BP better control, however glucose not in good control, CBGs range 200-300.  PLAN: Continue plavix and pravastatin for stroke prevention. Continue to check  glucose at home and record. Follow up with your primary care physician for stroke risk factor modification. Recommend maintain blood pressure goal <130/80, diabetes with hemoglobin A1c goal below 6.5% and lipids with LDL cholesterol goal below 70 mg/dL. Continue gabapentin 400mg  2 times a day Recommend EMG/[place] will schedule-  F/U in 6 months David Glass, [facility], [facility], APRN  [facility] [address] [place], [place] [address] [phone]

## 2017-05-12 ENCOUNTER — Ambulatory Visit (INDEPENDENT_AMBULATORY_CARE_PROVIDER_SITE_OTHER): Payer: Medicare Other | Admitting: Nurse Practitioner

## 2017-05-12 ENCOUNTER — Encounter: Payer: Self-pay | Admitting: Nurse Practitioner

## 2017-05-12 VITALS — BP 117/71 | HR 59 | Ht 75.0 in | Wt 234.6 lb

## 2017-05-12 DIAGNOSIS — E1142 Type 2 diabetes mellitus with diabetic polyneuropathy: Secondary | ICD-10-CM | POA: Diagnosis not present

## 2017-05-12 DIAGNOSIS — Z8673 Personal history of transient ischemic attack (TIA), and cerebral infarction without residual deficits: Secondary | ICD-10-CM | POA: Diagnosis not present

## 2017-05-12 DIAGNOSIS — I63332 Cerebral infarction due to thrombosis of left posterior cerebral artery: Secondary | ICD-10-CM

## 2017-05-12 DIAGNOSIS — N183 Chronic kidney disease, stage 3 (moderate): Secondary | ICD-10-CM | POA: Diagnosis not present

## 2017-05-12 DIAGNOSIS — E1122 Type 2 diabetes mellitus with diabetic chronic kidney disease: Secondary | ICD-10-CM | POA: Diagnosis not present

## 2017-05-12 DIAGNOSIS — I10 Essential (primary) hypertension: Secondary | ICD-10-CM

## 2017-05-12 DIAGNOSIS — E7800 Pure hypercholesterolemia, unspecified: Secondary | ICD-10-CM | POA: Diagnosis not present

## 2017-05-12 NOTE — Patient Instructions (Signed)
continue plavix and pravastatin for stroke prevention. Continue to check  glucose at home and record. Follow up with your primary care physician for stroke risk factor modification. Recommend maintain blood pressure goal <130/80, diabetes with hemoglobin A1c goal below 6.5% and lipids with LDL cholesterol goal below 70 mg/dL. Continue gabapentin 400mg  2 times a day Continue cymbalta 60mg  once a day. Recommend EMG/[place]-  F/U in 6 months

## 2017-05-13 NOTE — Progress Notes (Signed)
I reviewed above note and agree with the assessment and plan.  Rosalin Hawking, MD PhD Stroke Neurology 05/13/2017 5:09 PM

## 2017-05-18 DIAGNOSIS — E1121 Type 2 diabetes mellitus with diabetic nephropathy: Secondary | ICD-10-CM | POA: Diagnosis not present

## 2017-05-18 DIAGNOSIS — G4733 Obstructive sleep apnea (adult) (pediatric): Secondary | ICD-10-CM | POA: Diagnosis not present

## 2017-05-18 DIAGNOSIS — I10 Essential (primary) hypertension: Secondary | ICD-10-CM | POA: Diagnosis not present

## 2017-05-18 DIAGNOSIS — Z0000 Encounter for general adult medical examination without abnormal findings: Secondary | ICD-10-CM | POA: Diagnosis not present

## 2017-05-18 DIAGNOSIS — Z1211 Encounter for screening for malignant neoplasm of colon: Secondary | ICD-10-CM | POA: Diagnosis not present

## 2017-05-18 DIAGNOSIS — E785 Hyperlipidemia, unspecified: Secondary | ICD-10-CM | POA: Diagnosis not present

## 2017-05-18 DIAGNOSIS — F339 Major depressive disorder, recurrent, unspecified: Secondary | ICD-10-CM | POA: Diagnosis not present

## 2017-05-18 DIAGNOSIS — E538 Deficiency of other specified B group vitamins: Secondary | ICD-10-CM | POA: Diagnosis not present

## 2017-06-01 ENCOUNTER — Ambulatory Visit (INDEPENDENT_AMBULATORY_CARE_PROVIDER_SITE_OTHER): Payer: Medicare Other | Admitting: Neurology

## 2017-06-01 ENCOUNTER — Encounter: Payer: Self-pay | Admitting: Neurology

## 2017-06-01 ENCOUNTER — Ambulatory Visit (INDEPENDENT_AMBULATORY_CARE_PROVIDER_SITE_OTHER): Payer: Self-pay | Admitting: Neurology

## 2017-06-01 DIAGNOSIS — E1142 Type 2 diabetes mellitus with diabetic polyneuropathy: Secondary | ICD-10-CM

## 2017-06-01 NOTE — Procedures (Signed)
     HISTORY:  David Glass is a 67 year old gentleman with a history of diabetes who reports some numbness in the feet, but more recently has had numbness in both hands. The left hand is worse in the right, he has difficulty identifying objects that he is holding his hand. He reports no significant neck pain or radiation of pain down the arms from the neck.  NERVE CONDUCTION STUDIES:  Nerve conduction studies were performed on both upper extremities. The distal motor latencies and motor amplitudes for the median and ulnar nerves were within normal limits. The nerve conduction velocities for these nerves were also normal. The sensory latencies for the median and ulnar nerves were normal. The F wave latencies for the median and ulnar nerves were prolonged bilaterally.  Nerve conduction studies were performed on the left lower extremity. The distal motor latency for the left peroneal nerve was within normal limits but with a low motor amplitude. No response was seen for the left posterior tibial nerve. Slowing was seen for the left peroneal nerve below the knee, normal above the knee. The sensory latencies for the peroneal nerves were unobtainable bilaterally.   EMG STUDIES:  EMG study was performed on the left upper extremity:  The first dorsal interosseous muscle reveals 2 to 4 K units with full recruitment. No fibrillations or positive waves were noted. The abductor pollicis brevis muscle reveals 2 to 4 K units with full recruitment. No fibrillations or positive waves were noted. The extensor indicis proprius muscle reveals 1 to 3 K units with full recruitment. No fibrillations or positive waves were noted. The pronator teres muscle reveals 2 to 3 K units with full recruitment. No fibrillations or positive waves were noted. The biceps muscle reveals 1 to 2 K units with full recruitment. No fibrillations or positive waves were noted. The triceps muscle reveals 2 to 4 K units with full  recruitment. No fibrillations or positive waves were noted. The anterior deltoid muscle reveals 2 to 3 K units with full recruitment. No fibrillations or positive waves were noted. The cervical paraspinal muscles were tested at 2 levels. No abnormalities of insertional activity were seen at either level tested. There was good relaxation.   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 The nerve conduction studies on both upper extremities were relatively unremarkable, however. EMG evaluation of the left upper extremity was unremarkable, no evidence of an overlying cervical radiculopathy is seen.  Jill Alexanders MD 06/01/2017 1:15 PM  [facility] [address] [place], [place] [address]  Phone [phone] Fax [phone]

## 2017-06-01 NOTE — Progress Notes (Signed)
[place]    Nerve / Sites Muscle Latency Ref. Amplitude Ref. Rel Amp Segments Distance Velocity Ref. Area    ms ms mV mV %  cm m/s m/s mVms  L Median - APB     Wrist APB 3.9 ?4.4 10.1 ?4.0 100 Wrist - APB 7   31.4     Upper arm APB 8.6     Upper arm - Wrist 25 53 ?49   R Median - APB     Wrist APB 4.0 ?4.4 9.9 ?4.0 100 Wrist - APB 7   29.7     Upper arm APB 8.6  9.9  99.7 Upper arm - Wrist 25 55 ?49 32.3  L Ulnar - ADM     Wrist ADM 3.1 ?3.3 12.3 ?6.0 100 Wrist - ADM 7   38.2     B.Elbow ADM 7.3  10.0  80.9 B.Elbow - Wrist 22 52 ?49 31.2     A.Elbow ADM 8.9  10.8  109 A.Elbow - B.Elbow 10 66 ?49 34.6         A.Elbow - Wrist      R Ulnar - ADM     Wrist ADM 3.2 ?3.3 15.4 ?6.0 100 Wrist - ADM 7   42.1     B.Elbow ADM 7.3  13.2  85.5 B.Elbow - Wrist 22 54 ?49 37.1     A.Elbow ADM 9.0  14.0  106 A.Elbow - B.Elbow 10 58 ?49 39.8         A.Elbow - Wrist      L Peroneal - EDB     Ankle EDB 6.3 ?6.5 0.4 ?2.0 100 Ankle - EDB 9   1.6     Fib head EDB 16.1  0.6  125 Fib head - Ankle 39 40 ?44 1.9     Pop fossa EDB 18.3  0.5  94.5 Pop fossa - Fib head 10 47 ?44 1.9         Pop fossa - Ankle      L Tibial - AH     Ankle AH NR ?5.8 NR ?4.0 NR Ankle - AH 9   NR     Pop fossa AH NR  NR  NR Pop fossa - Ankle   ?[place]    Nerve / Sites Rec. Site Peak Lat Amp Segments Distance    ms V  cm  L Median - Orthodromic (Dig II, Mid palm)     Dig II Wrist 3.5 21 Dig II - Wrist 13  R Median - Orthodromic (Dig II, Mid palm)     Dig II Wrist 3.7 14 Dig II - Wrist 13  L Ulnar - Orthodromic, (Dig V, Mid palm)     Dig V Wrist 3.1 8 Dig V - Wrist 11  R Ulnar - Orthodromic, (Dig V, Mid palm)     Dig V Wrist 3.4 18 Dig V - Wrist 11             SNC    Nerve / Sites Rec. Site Peak Lat Ref.  Amp Ref. Segments Distance    ms ms V V  cm  L Superficial peroneal - Ankle     Lat leg Ankle NR ?4.4 NR ?6 Lat leg - Ankle 14       F  Wave    Nerve F Lat Ref.   ms ms  L Median - APB 31.8  ?31.0  L Ulnar - ADM 34.6 ?32.0  R Median - APB 32.7 ?31.0  R Ulnar - ADM 33.3 ?32.0             EMG full

## 2017-06-01 NOTE — Progress Notes (Signed)
Please refer to EMG and nerve conduction study procedure note. 

## 2017-06-02 ENCOUNTER — Telehealth: Payer: Self-pay | Admitting: Nurse Practitioner

## 2017-06-02 MED ORDER — NORTRIPTYLINE HCL 10 MG PO CAPS
10.0000 mg | ORAL_CAPSULE | Freq: Every day | ORAL | 6 refills | Status: AC
Start: 2017-06-02 — End: ?

## 2017-06-02 NOTE — Telephone Encounter (Signed)
Discussed EMG/[place] with Dr. Erlinda Hong. nerve conduction/EMG  shows moderate to severe neuropathy Pt already on gabapentin 400 twice daily which cannot be increased due to stage III kidney disease. He has failed Lyrica and Cymbalta in the past Dr. Erlinda Hong suggests nortriptyline This was called in to his local pharmacy. take as directed Please call the pt

## 2017-06-02 NOTE — Telephone Encounter (Signed)
Spoke with patient and informed him of NCS results per NP's note. Advised that Hoyle Sauer discussed with Dr Erlinda Hong, and Dr Erlinda Hong recommended nortriptyline. Informed him that a new prescription has been sent to [facility], gave him instructions of how to take. Advised he call after starting medication for any concerns, questions. He verbalized understanding, appreciation.

## 2017-06-24 DIAGNOSIS — I10 Essential (primary) hypertension: Secondary | ICD-10-CM | POA: Diagnosis not present

## 2017-06-24 DIAGNOSIS — E119 Type 2 diabetes mellitus without complications: Secondary | ICD-10-CM | POA: Diagnosis not present

## 2017-06-24 DIAGNOSIS — N183 Chronic kidney disease, stage 3 (moderate): Secondary | ICD-10-CM | POA: Diagnosis not present

## 2017-07-22 DIAGNOSIS — E119 Type 2 diabetes mellitus without complications: Secondary | ICD-10-CM | POA: Diagnosis not present

## 2017-07-27 ENCOUNTER — Other Ambulatory Visit: Payer: Self-pay | Admitting: *Deleted

## 2017-07-27 NOTE — Telephone Encounter (Signed)
Nortriptyline Refill  Rx successfully faxed to [facility], [place]

## 2017-11-16 NOTE — Progress Notes (Signed)
[facility]  PATIENT: David Glass DOB: [birth year]   REASON FOR VISIT: Follow-up for history of stroke, or any paresthesias in the fingers and feet HISTORY FROM: Patient and wife    HISTORY OF PRESENT ILLNESS:Mr. David Glass is a 67 year old male with history of hypertension, diabetes, hyperlipidemia, OSA not on CPAP presented with right-sided numbness on 12/06/14. MRI showed left thalamic stroke, consistent with small vessel disease in left PCA territory. 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 He was put on dural antiplatelet and continued on statin. LDL 65 and A1C 6.6. 2D echo not remarkable but carotid doppler was not done before discharge.   He went back to ER on 12/12/14 due to new onset left sided numbness and mild weakness. Had MRI done again showed right thalamic stroke. His neuro stable and he was sent home from ER.   01/10/15 follow up - the patient has been doing well. No more recurrent symptoms. He still has mild bilateral hands numbness intermittently, but no weakness. His BP 168/89 today, as he run out of his propranolol. His glucose controlled well at home as per pt. He follows with David Glass regularly.  04/16/15 follow up - he was doing well. His ASA has been discontinued after total 3 months of dural antiplatelet. His BP in good control at home and today is 120/68 in clinic. His glucose at home also < 120 most of the time. He still has post stroke fatigue so not doing much exercise every day. He is requesting to limit his Madaline Savage duty for the next 6 months. 30 day cardiac monitoring negative for afib. But CUS and TCD not done yet.  10/17/15 follow up - pt has been doing well from stroke standpoint. However, he continues to have bilateral hand numbness tingling to the point that he has to wear golves today with him due to the discomfort. He also has numbness tingling in bilateral toes but not as bad as fingers. He has tried on time with  300mg  gabapentin but it made him sleepy. BP at home good, today 123/70. Glucose at 110-120, most recent A1C 6.7. However, CUS has not been done yet.  04/15/16 follow up - pt has been doing well, no stroke symptoms. He continues to have numbness and tingling in feet and hands, with hands more severe than feet. He did not check glucose at home though. BP today 129/74. He is tolerating gabapentin now but wife still think he is pretty sleepy during the day. He refused to try 300mg  at this time but willing to try 200mg  tid. Also agree to try cymbalta at this time. CUS not done yet and said nobody called him for appointment.  Interval History2/6/18 David Glass During the interval time, pt has been doing the same. He continues to have numbness and tingling in feet and hands, with hands 10 times more severe than feet. He is on gabapentin 400 mg twice a day and Cymbalta 60 mg once a day. However, they are minimally effective. He had been on Lyrica before, not effective. His nephrologist against further increase gabapentin dose due to CKD. BP today 120/72, however glucose not eating good control, recent A1c 7.8.  UPDATE 08/22/2018CM David Glass, 67 year old male returns for follow-up with history of stroke in March 2016. He continues to do well from a stroke standpoint. He is currently on Plavix for secondary stroke prevention without further stroke or TIA symptoms. His diabetes remains out of control and his CBGs  average around 200-300 her wife. He continues to have numbness tingling in the feet and hands with hands worse at the feet. He claims this has been since stroke he has not had EMG nerve conduction done. He has been on Lyrica before which was not effective. He has stage III kidney disease and his nephrologist is against further increases in his gabapentin. He is currently on 400 twice a day. He has stopped Cymbalta 60 mg daily because he says it is not effective. He denies any falls. He returns for  reevaluation UPDATE 2/27/2019CM David Glass, 67 year old male returns for follow-up with history of stroke in March 2016.  He continues to do well from a stroke standpoint.  He remains on Plavix without bruising or bleeding.  No further stroke or TIA symptoms.  He has continued to have fluctuating blood sugars with increased insulin dosing.  He continues to have numbness and tingling in the feet and hands with hands being more problematic than the feet.  He is failed Lyrica in the past.  He is currently on gabapentin however due to renal disease cannot continue to titrate dose.  He has also been on Cymbalta.  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  No evidence of cervical radiculopathy he wears gloves on both hands he was asked to try nortriptyline after he was given his results however he did not take the medication as prescribed.  He wears gloves on both hands.  He returns for reevaluation  REVIEW OF SYSTEMS: Full 14 system review of systems performed and notable only for those listed, all others are neg:  Constitutional: neg  Cardiovascular: neg Ear/Nose/Throat: neg  Skin: neg Eyes: neg Respiratory: neg Gastroitestinal: neg  Hematology/Lymphatic: neg  Endocrine: neg Musculoskeletal:neg Allergy/Immunology: neg Neurological: Numbness in fingers and toes 24/7 Psychiatric: neg Sleep : neg   ALLERGIES: Allergies  Allergen Reactions  . Alka-Seltzer Plus Cold [Chlorphen-Phenyleph-Asa] Other (See Comments)    "Knocks me out"    HOME MEDICATIONS: Outpatient Medications Prior to Visit  Medication Sig Dispense Refill  . amLODipine (NORVASC) 10 MG tablet TAKE 1 TABLET BY MOUTH EVERY DAY 90 tablet 3  . clopidogrel (PLAVIX) 75 MG tablet Take 1 tablet (75 mg total) by mouth daily. 30 tablet 2  . gabapentin (NEURONTIN) 100 MG capsule Take 2 capsules (200 mg total) by mouth 3 (three) times daily. (Patient  taking differently: Take 400 mg by mouth. ) 180 capsule 2  . glipiZIDE (GLUCOTROL) 5 MG tablet Take 0.5 tablets (2.5 mg total) by mouth 2 (two) times daily before a meal. (Patient taking differently: Take 10 mg by mouth 2 (two) times daily before a meal. ) 60 tablet 0  . Insulin Detemir (LEVEMIR FLEXTOUCH) 100 UNIT/ML Pen Inject 40 Units into the skin daily at 10 pm.     . lisinopril (PRINIVIL,ZESTRIL) 5 MG tablet Take 5 mg by mouth at bedtime.  12  . pravastatin (PRAVACHOL) 20 MG tablet Take 20 mg by mouth daily.  11  . propranolol (INDERAL) 80 MG tablet Take 80 mg by mouth 3 (three) times daily.    . nortriptyline (PAMELOR) 10 MG capsule Take 1 capsule (10 mg total) by mouth at bedtime. 1 pill daily at bedtime for 1 week then increase to 2 at night (Patient not taking: Reported on 11/17/2017) 60 capsule 6   No facility-administered medications prior to visit.     PAST MEDICAL HISTORY: Past Medical History:  Diagnosis Date  . B12 deficiency 12/05/2014  . CKD stage 3 due to type 2 diabetes mellitus ([place]) 12/05/2014  . Diabetes mellitus without complication ([place])   . High cholesterol   . Hypertension   . Neuropathy, peripheral 12/05/2014  . Stroke [facility])     PAST SURGICAL HISTORY: No past surgical history on file.  FAMILY HISTORY: Family History  Problem Relation Age of Onset  . Hyperlipidemia Mother   . Hypertension Mother   . Hypertension Father   . Hyperlipidemia Father   . Stroke Brother     SOCIAL HISTORY: Social History   Socioeconomic History  . Marital status: Married    Spouse name: Not on file  . Number of children: Not on file  . Years of education: Not on file  . Highest education level: Not on file  Social Needs  . Financial resource strain: Not on file  . Food insecurity - worry: Not on file  . Food insecurity - inability: Not on file  . Transportation needs - medical: Not on file  . Transportation needs - non-medical: Not on file  Occupational History  .  Not on file  Tobacco Use  . Smoking status: Never Smoker  . Smokeless tobacco: Never Used  Substance and Sexual Activity  . Alcohol use: Yes    Alcohol/week: 0.6 oz    Types: 1 Glasses of wine per week    Comment: occasional  . Drug use: No  . Sexual activity: Not on file  Other Topics Concern  . Not on file  Social History Narrative  . Not on file     PHYSICAL EXAM  Vitals:   11/17/17 1421  BP: 118/70  Pulse: 67  SpO2: 98%  Weight: 226 lb 3.2 oz (102.6 kg)  Height: 6\' 3"  (1.905 m)   Body mass index is 28.27 kg/m.  Generalized: Well developed, in no acute distress  Head: normocephalic and atraumatic,. Oropharynx benign  Neck: Supple, no carotid bruits  Cardiac: Regular rate rhythm, no murmur  Musculoskeletal: No deformity   Neurological examination   Mentation: Alert oriented to time, place, history taking. Attention span and concentration appropriate. Recent and remote memory intact.  Follows all commands speech and language fluent.   Cranial nerve II-XII: Pupils were equal round reactive to light extraocular movements were full, visual field were full on confrontational test. Facial sensation and strength were normal. hearing was intact to finger rubbing bilaterally. Uvula tongue midline. head turning and shoulder shrug were normal and symmetric.Tongue protrusion into cheek strength was normal. Motor: normal bulk and tone, full strength in the BUE, BLE, fine finger movements normal, no pronator drift. No focal weakness Sensory: Light touch, temperature/pinprick were assessed and were symmetrical except bilateral hand decreased light touch and pinprick. Coordination: finger-nose-finger, heel-to-shin bilaterally, no dysmetria,No tremor Reflexes: Symmetric upper and lower, plantar responses were flexor bilaterally. Gait and Station: Rising up from seated position without assistance, normal stance,  moderate stride, good arm swing, smooth turning, able to perform tiptoe,  and heel walking without difficulty. Tandem gait is unsteady.  No assistive device  DIAGNOSTIC DATA (LABS, IMAGING, TESTING) - I reviewed patient records, labs, notes, testing and imaging myself where available.  Lab Results  Component Value Date   WBC 10.3 12/06/2014   HGB 12.8 (L) 12/06/2014   HCT 38.3 (L) 12/06/2014   MCV 91.0 12/06/2014   PLT 204 12/06/2014      Component Value Date/Time   NA 146 (H) 12/06/2014 0525   K  3.7 12/06/2014 0525   CL 113 (H) 12/06/2014 0525   CO2 20 12/06/2014 0525   GLUCOSE 169 (H) 12/06/2014 0525   BUN 22 12/06/2014 0525   CREATININE 1.77 (H) 12/06/2014 0525   CALCIUM 9.7 12/06/2014 0525   PROT 6.7 12/05/2014 1140   ALBUMIN 4.0 12/05/2014 1140   AST 20 12/05/2014 1140   ALT 25 12/05/2014 1140   ALKPHOS 81 12/05/2014 1140   BILITOT 0.7 12/05/2014 1140   GFRNONAA 38 (L) 12/06/2014 0525   GFRAA 44 (L) 12/06/2014 0525   Lab Results  Component Value Date   CHOL 154 12/06/2014   HDL 31 (L) 12/06/2014   LDLCALC 65 12/06/2014   TRIG 291 (H) 12/06/2014   CHOLHDL 5.0 12/06/2014   Lab Results  Component Value Date   HGBA1C 6.8 (H) 12/05/2014   Lab Results  Component Value Date   VITAMINB12 1,330 (H) 12/05/2014   Lab Results  Component Value Date   TSH 3.212 12/05/2014     EMG nerve conduction  performed 06/01/2017 shows evidence of 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  No evidence of cervical radiculopathy  ASSESSMENT AND PLAN 67 y.o. Caucasian male with PMH of HTN, HLD, DM, OSA not on CPAP was admitted on 12/05/14 for left thalamic stroke. MRA showed bilateral PCA and right VA atherosclerosis. Again found to have right thalamic stroke on 12/12/14, confirmed with MRI. His bilateral stroke still consistent with several disease of veterbrobasilar atherosclerosis, especially b/l PCA, less likely cardioembolic. Cardiac event monitoring negative for afib.  He has bilateral fingers and toes numbness and tingling,  most likely due to diabetic neuropathy. Currently on gabapentin 400 mg twice a day. Has tried Lyrica and Cymbalta in the past not effective. BP better control, however glucose not in good control,The patient is a current patient of David Glass  who is out of the office today . This note is sent to the work in doctor.      PLAN: Continue plavix and pravastatin for stroke prevention. Continue to check  glucose at home and record.  Continue insulin Follow up with your primary care physician for stroke risk factor modification. Recommend maintain blood pressure goal <130/80, diabetes with hemoglobin A1c goal below 6.5% and lipids with LDL cholesterol goal below 70 mg/dL. Continue gabapentin 400mg  2 times a day Nortriptyline10 mg at bedtime for 1 week, increase by 10 mg every week until dosages at  40 mg. F/U in 6 months [name], [facility], [facility], APRN  [facility] [address], [place], [place] [address] [phone]

## 2017-11-17 ENCOUNTER — Ambulatory Visit (INDEPENDENT_AMBULATORY_CARE_PROVIDER_SITE_OTHER): Payer: Medicare Other | Admitting: Nurse Practitioner

## 2017-11-17 ENCOUNTER — Encounter: Payer: Self-pay | Admitting: Nurse Practitioner

## 2017-11-17 VITALS — BP 118/70 | HR 67 | Ht 75.0 in | Wt 226.2 lb

## 2017-11-17 DIAGNOSIS — G6289 Other specified polyneuropathies: Secondary | ICD-10-CM

## 2017-11-17 DIAGNOSIS — I10 Essential (primary) hypertension: Secondary | ICD-10-CM | POA: Diagnosis not present

## 2017-11-17 DIAGNOSIS — Z8673 Personal history of transient ischemic attack (TIA), and cerebral infarction without residual deficits: Secondary | ICD-10-CM | POA: Diagnosis not present

## 2017-11-17 DIAGNOSIS — E1122 Type 2 diabetes mellitus with diabetic chronic kidney disease: Secondary | ICD-10-CM

## 2017-11-17 DIAGNOSIS — N183 Chronic kidney disease, stage 3 (moderate): Secondary | ICD-10-CM

## 2017-11-17 NOTE — Patient Instructions (Signed)
Continue plavix and pravastatin for stroke prevention. Continue to check  glucose at home and record.  Continue insulin Follow up with your primary care physician for stroke risk factor modification. Recommend maintain blood pressure goal <130/80, diabetes with hemoglobin A1c goal below 6.5% and lipids with LDL cholesterol goal below 70 mg/dL. Continue gabapentin 400mg  2 times a day Nortriptyline10 mg at bedtime for 1 week, increase by 10 mg every week until dosages 40 mg. F/U in 6 months

## 2017-11-18 NOTE — Progress Notes (Signed)
I have read the note, and I agree with the clinical assessment and plan.  Damaso Laday A. Wilver Tignor, MD, PhD, FAAN Certified in Neurology, Clinical Neurophysiology, Sleep Medicine, Pain Medicine and Neuroimaging  [facility] [address], [address] [place], [place] [address] [phone]  

## 2017-12-02 ENCOUNTER — Other Ambulatory Visit: Payer: Self-pay

## 2017-12-02 DIAGNOSIS — E1142 Type 2 diabetes mellitus with diabetic polyneuropathy: Secondary | ICD-10-CM

## 2017-12-02 MED ORDER — GABAPENTIN 100 MG PO CAPS: ORAL_CAPSULE | ORAL | 1 refills | Status: DC

## 2018-01-28 DIAGNOSIS — N183 Chronic kidney disease, stage 3 (moderate): Secondary | ICD-10-CM | POA: Diagnosis not present

## 2018-02-01 ENCOUNTER — Telehealth: Payer: Self-pay | Admitting: *Deleted

## 2018-02-01 DIAGNOSIS — I129 Hypertensive chronic kidney disease with stage 1 through stage 4 chronic kidney disease, or unspecified chronic kidney disease: Secondary | ICD-10-CM | POA: Diagnosis not present

## 2018-02-01 DIAGNOSIS — E1122 Type 2 diabetes mellitus with diabetic chronic kidney disease: Secondary | ICD-10-CM | POA: Diagnosis not present

## 2018-02-01 DIAGNOSIS — N183 Chronic kidney disease, stage 3 (moderate): Secondary | ICD-10-CM | POA: Diagnosis not present

## 2018-02-01 DIAGNOSIS — E1142 Type 2 diabetes mellitus with diabetic polyneuropathy: Secondary | ICD-10-CM

## 2018-02-01 NOTE — Telephone Encounter (Signed)
Patient stopped by the office to leave this message.  States he got his Gabapentin refilled and the directions on the RX are wrong.  He takes 4 capsules 3 times a day but the RX has 2 capsules 2 times a day.  He wants to pick up a hard copy of the RX so he can take it to the pharmacy. Please call with questions and when ready for pick up

## 2018-02-01 NOTE — Telephone Encounter (Signed)
Called patient to discuss Gabapentin. He stated he has been taking gabapentin 400 mg two times daily for at least a year. He stated Dr Erlinda Hong had told him he could increase it until it was effective, and it is effective enough he stated at this dose. He stated the last refill he picked up is for gabapentin 100 mg, take 2 caps twice a day. He's requesting new Rx be sent in to reflect his current dose. This RN advised will send to NP. [facility] will contact him when new Rx is ready. He verbalized understanding, appreciation.

## 2018-02-01 NOTE — Telephone Encounter (Signed)
This is David Glass note on why he is only on twice a day.  [name], NP       06/02/17 10:59 AM  Note    Discussed EMG/[place] with Dr. Erlinda Hong. nerve conduction/EMG  shows moderate to severe neuropathy Pt already on gabapentin 400 twice daily which cannot be increased due to stage III kidney disease. He has failed Lyrica and Cymbalta in the past Dr. Erlinda Hong suggests nortriptyline This was called in to his local pharmacy. take as directed Please call the pt

## 2018-02-02 MED ORDER — GABAPENTIN 100 MG PO CAPS: ORAL_CAPSULE | ORAL | 1 refills | Status: AC

## 2018-02-02 NOTE — Addendum Note (Signed)
Addended by: Otilio Jefferson on: 02/02/2018 09:06 AM   Modules accepted: Orders

## 2018-05-19 ENCOUNTER — Ambulatory Visit: Payer: Medicare Other | Admitting: Nurse Practitioner

## 2018-06-01 DIAGNOSIS — E538 Deficiency of other specified B group vitamins: Secondary | ICD-10-CM | POA: Diagnosis not present

## 2018-06-01 DIAGNOSIS — I10 Essential (primary) hypertension: Secondary | ICD-10-CM | POA: Diagnosis not present

## 2018-06-01 DIAGNOSIS — G4733 Obstructive sleep apnea (adult) (pediatric): Secondary | ICD-10-CM | POA: Diagnosis not present

## 2018-06-01 DIAGNOSIS — E785 Hyperlipidemia, unspecified: Secondary | ICD-10-CM | POA: Diagnosis not present

## 2018-06-01 DIAGNOSIS — E1121 Type 2 diabetes mellitus with diabetic nephropathy: Secondary | ICD-10-CM | POA: Diagnosis not present

## 2018-06-01 DIAGNOSIS — N183 Chronic kidney disease, stage 3 (moderate): Secondary | ICD-10-CM | POA: Diagnosis not present

## 2018-06-01 DIAGNOSIS — F330 Major depressive disorder, recurrent, mild: Secondary | ICD-10-CM | POA: Diagnosis not present

## 2018-06-01 DIAGNOSIS — G629 Polyneuropathy, unspecified: Secondary | ICD-10-CM | POA: Diagnosis not present

## 2018-06-01 DIAGNOSIS — Z7984 Long term (current) use of oral hypoglycemic drugs: Secondary | ICD-10-CM | POA: Diagnosis not present

## 2018-06-05 ENCOUNTER — Other Ambulatory Visit: Payer: Self-pay | Admitting: Nurse Practitioner

## 2018-06-05 DIAGNOSIS — E1142 Type 2 diabetes mellitus with diabetic polyneuropathy: Secondary | ICD-10-CM

## 2018-07-18 DIAGNOSIS — I10 Essential (primary) hypertension: Secondary | ICD-10-CM | POA: Diagnosis not present

## 2018-07-18 DIAGNOSIS — E785 Hyperlipidemia, unspecified: Secondary | ICD-10-CM | POA: Diagnosis not present

## 2018-07-18 DIAGNOSIS — Z7984 Long term (current) use of oral hypoglycemic drugs: Secondary | ICD-10-CM | POA: Diagnosis not present

## 2018-07-18 DIAGNOSIS — E1121 Type 2 diabetes mellitus with diabetic nephropathy: Secondary | ICD-10-CM | POA: Diagnosis not present

## 2018-07-18 DIAGNOSIS — Z0000 Encounter for general adult medical examination without abnormal findings: Secondary | ICD-10-CM | POA: Diagnosis not present

## 2018-09-05 DIAGNOSIS — Z1211 Encounter for screening for malignant neoplasm of colon: Secondary | ICD-10-CM | POA: Diagnosis not present

## 2018-09-05 DIAGNOSIS — J019 Acute sinusitis, unspecified: Secondary | ICD-10-CM | POA: Diagnosis not present

## 2018-09-05 DIAGNOSIS — B9689 Other specified bacterial agents as the cause of diseases classified elsewhere: Secondary | ICD-10-CM | POA: Diagnosis not present

## 2018-09-08 DIAGNOSIS — H25013 Cortical age-related cataract, bilateral: Secondary | ICD-10-CM | POA: Diagnosis not present

## 2018-09-08 DIAGNOSIS — E119 Type 2 diabetes mellitus without complications: Secondary | ICD-10-CM | POA: Diagnosis not present

## 2018-09-08 DIAGNOSIS — H2513 Age-related nuclear cataract, bilateral: Secondary | ICD-10-CM | POA: Diagnosis not present

## 2018-11-01 DIAGNOSIS — Z1211 Encounter for screening for malignant neoplasm of colon: Secondary | ICD-10-CM | POA: Diagnosis not present

## 2019-03-02 DIAGNOSIS — R195 Other fecal abnormalities: Secondary | ICD-10-CM | POA: Diagnosis not present

## 2019-03-31 DIAGNOSIS — K640 First degree hemorrhoids: Secondary | ICD-10-CM | POA: Diagnosis not present

## 2019-03-31 DIAGNOSIS — K635 Polyp of colon: Secondary | ICD-10-CM | POA: Diagnosis not present

## 2019-03-31 DIAGNOSIS — D122 Benign neoplasm of ascending colon: Secondary | ICD-10-CM | POA: Diagnosis not present

## 2019-03-31 DIAGNOSIS — D125 Benign neoplasm of sigmoid colon: Secondary | ICD-10-CM | POA: Diagnosis not present

## 2019-03-31 DIAGNOSIS — K5730 Diverticulosis of large intestine without perforation or abscess without bleeding: Secondary | ICD-10-CM | POA: Diagnosis not present

## 2019-03-31 DIAGNOSIS — R195 Other fecal abnormalities: Secondary | ICD-10-CM | POA: Diagnosis not present

## 2019-04-04 DIAGNOSIS — D125 Benign neoplasm of sigmoid colon: Secondary | ICD-10-CM | POA: Diagnosis not present

## 2019-04-04 DIAGNOSIS — D122 Benign neoplasm of ascending colon: Secondary | ICD-10-CM | POA: Diagnosis not present

## 2019-04-04 DIAGNOSIS — K635 Polyp of colon: Secondary | ICD-10-CM | POA: Diagnosis not present

## 2019-05-22 DIAGNOSIS — N183 Chronic kidney disease, stage 3 (moderate): Secondary | ICD-10-CM | POA: Diagnosis not present

## 2019-05-22 DIAGNOSIS — E1122 Type 2 diabetes mellitus with diabetic chronic kidney disease: Secondary | ICD-10-CM | POA: Diagnosis not present

## 2019-05-22 DIAGNOSIS — I129 Hypertensive chronic kidney disease with stage 1 through stage 4 chronic kidney disease, or unspecified chronic kidney disease: Secondary | ICD-10-CM | POA: Diagnosis not present

## 2019-05-22 DIAGNOSIS — Z1159 Encounter for screening for other viral diseases: Secondary | ICD-10-CM | POA: Diagnosis not present

## 2019-08-08 DIAGNOSIS — G629 Polyneuropathy, unspecified: Secondary | ICD-10-CM | POA: Diagnosis not present

## 2019-12-01 ENCOUNTER — Ambulatory Visit: Payer: Medicare Other | Attending: Internal Medicine

## 2019-12-01 DIAGNOSIS — Z23 Encounter for immunization: Secondary | ICD-10-CM

## 2019-12-01 NOTE — Progress Notes (Signed)
   Covid-19 Vaccination Clinic  Name:  KWADWO TARAS    MRN: [id number] DOB: 11-Jul-1951  12/01/2019  Mr. Louth was observed post Covid-19 immunization for 15 minutes without incident. He was provided with Vaccine Information Sheet and instruction to access the V-Safe system.   Mr. Milo was instructed to call 911 with any severe reactions post vaccine: Marland Kitchen Difficulty breathing  . Swelling of face and throat  . A fast heartbeat  . A bad rash all over body  . Dizziness and weakness   Immunizations Administered    Name Date Dose VIS Date Route   Pfizer COVID-19 Vaccine 12/01/2019 10:12 AM 0.3 mL 09/01/2019 Intramuscular   Manufacturer: [place]   Lot: [id number]   [place]: [address]-1

## 2019-12-07 DIAGNOSIS — E119 Type 2 diabetes mellitus without complications: Secondary | ICD-10-CM | POA: Diagnosis not present

## 2019-12-25 ENCOUNTER — Ambulatory Visit: Payer: Medicare Other | Attending: Internal Medicine

## 2019-12-25 DIAGNOSIS — Z23 Encounter for immunization: Secondary | ICD-10-CM

## 2019-12-25 NOTE — Progress Notes (Signed)
   Covid-19 Vaccination Clinic  Name:  David Glass    MRN: [id number] DOB: Dec 22, 1950  12/25/2019  Mr. David Glass was observed post Covid-19 immunization for 15 minutes without incident. He was provided with Vaccine Information Sheet and instruction to access the V-Safe system.   Mr. David Glass was instructed to call 911 with any severe reactions post vaccine: Marland Kitchen Difficulty breathing  . Swelling of face and throat  . A fast heartbeat  . A bad rash all over body  . Dizziness and weakness   Immunizations Administered    Name Date Dose VIS Date Route   Pfizer COVID-19 Vaccine 12/25/2019  1:33 PM 0.3 mL 09/01/2019 Intramuscular   Manufacturer: [place]   Lot: [id number]   [place]: [address]-9

## 2020-01-30 DIAGNOSIS — H25013 Cortical age-related cataract, bilateral: Secondary | ICD-10-CM | POA: Diagnosis not present

## 2020-01-30 DIAGNOSIS — H18413 Arcus senilis, bilateral: Secondary | ICD-10-CM | POA: Diagnosis not present

## 2020-01-30 DIAGNOSIS — H2513 Age-related nuclear cataract, bilateral: Secondary | ICD-10-CM | POA: Diagnosis not present

## 2020-01-30 DIAGNOSIS — H25043 Posterior subcapsular polar age-related cataract, bilateral: Secondary | ICD-10-CM | POA: Diagnosis not present

## 2020-01-30 DIAGNOSIS — H2511 Age-related nuclear cataract, right eye: Secondary | ICD-10-CM | POA: Diagnosis not present

## 2020-02-09 DIAGNOSIS — H2511 Age-related nuclear cataract, right eye: Secondary | ICD-10-CM | POA: Diagnosis not present

## 2020-02-09 DIAGNOSIS — H2512 Age-related nuclear cataract, left eye: Secondary | ICD-10-CM | POA: Diagnosis not present

## 2020-03-08 DIAGNOSIS — H2511 Age-related nuclear cataract, right eye: Secondary | ICD-10-CM | POA: Diagnosis not present

## 2020-04-19 DIAGNOSIS — E538 Deficiency of other specified B group vitamins: Secondary | ICD-10-CM | POA: Diagnosis not present

## 2020-04-19 DIAGNOSIS — I10 Essential (primary) hypertension: Secondary | ICD-10-CM | POA: Diagnosis not present

## 2020-04-19 DIAGNOSIS — E1121 Type 2 diabetes mellitus with diabetic nephropathy: Secondary | ICD-10-CM | POA: Diagnosis not present

## 2020-04-19 DIAGNOSIS — E785 Hyperlipidemia, unspecified: Secondary | ICD-10-CM | POA: Diagnosis not present

## 2020-04-23 DIAGNOSIS — N183 Chronic kidney disease, stage 3 unspecified: Secondary | ICD-10-CM | POA: Diagnosis not present

## 2020-04-23 DIAGNOSIS — F339 Major depressive disorder, recurrent, unspecified: Secondary | ICD-10-CM | POA: Diagnosis not present

## 2020-04-23 DIAGNOSIS — G4733 Obstructive sleep apnea (adult) (pediatric): Secondary | ICD-10-CM | POA: Diagnosis not present

## 2020-04-23 DIAGNOSIS — F330 Major depressive disorder, recurrent, mild: Secondary | ICD-10-CM | POA: Diagnosis not present

## 2020-04-23 DIAGNOSIS — Z0000 Encounter for general adult medical examination without abnormal findings: Secondary | ICD-10-CM | POA: Diagnosis not present

## 2020-06-24 ENCOUNTER — Other Ambulatory Visit: Payer: Self-pay | Admitting: Neurosurgery

## 2020-06-24 DIAGNOSIS — M47812 Spondylosis without myelopathy or radiculopathy, cervical region: Secondary | ICD-10-CM

## 2020-06-24 DIAGNOSIS — Z6829 Body mass index (BMI) 29.0-29.9, adult: Secondary | ICD-10-CM | POA: Diagnosis not present

## 2020-06-24 DIAGNOSIS — I10 Essential (primary) hypertension: Secondary | ICD-10-CM | POA: Diagnosis not present

## 2020-06-25 ENCOUNTER — Ambulatory Visit: Payer: Medicare Other | Attending: Internal Medicine

## 2020-06-25 DIAGNOSIS — Z23 Encounter for immunization: Secondary | ICD-10-CM

## 2020-06-25 NOTE — Progress Notes (Signed)
   Covid-19 Vaccination Clinic  Name:  David Glass    MRN: [id number] DOB: April 23, 1951  06/25/2020  Mr. David Glass was observed post Covid-19 immunization for 15 minutes without incident. He was provided with Vaccine Information Sheet and instruction to access the V-Safe system.   Mr. David Glass was instructed to call 911 with any severe reactions post vaccine: Marland Kitchen Difficulty breathing  . Swelling of face and throat  . A fast heartbeat  . A bad rash all over body  . Dizziness and weakness

## 2020-07-25 ENCOUNTER — Ambulatory Visit
Admission: RE | Admit: 2020-07-25 | Discharge: 2020-07-25 | Disposition: A | Discharge status: Home / Self Care | Payer: Medicare Other | Source: Ambulatory Visit | Attending: Neurosurgery | Admitting: Neurosurgery

## 2020-07-25 DIAGNOSIS — M4319 Spondylolisthesis, multiple sites in spine: Secondary | ICD-10-CM | POA: Diagnosis not present

## 2020-07-25 DIAGNOSIS — M50221 Other cervical disc displacement at C4-C5 level: Secondary | ICD-10-CM | POA: Diagnosis not present

## 2020-07-25 DIAGNOSIS — M4802 Spinal stenosis, cervical region: Secondary | ICD-10-CM | POA: Diagnosis not present

## 2020-07-25 DIAGNOSIS — M47812 Spondylosis without myelopathy or radiculopathy, cervical region: Secondary | ICD-10-CM

## 2020-07-25 DIAGNOSIS — M50223 Other cervical disc displacement at C6-C7 level: Secondary | ICD-10-CM | POA: Diagnosis not present

## 2020-07-29 DIAGNOSIS — E1122 Type 2 diabetes mellitus with diabetic chronic kidney disease: Secondary | ICD-10-CM | POA: Diagnosis not present

## 2020-07-29 DIAGNOSIS — I129 Hypertensive chronic kidney disease with stage 1 through stage 4 chronic kidney disease, or unspecified chronic kidney disease: Secondary | ICD-10-CM | POA: Diagnosis not present

## 2020-07-29 DIAGNOSIS — N183 Chronic kidney disease, stage 3 unspecified: Secondary | ICD-10-CM | POA: Diagnosis not present

## 2020-08-30 DIAGNOSIS — H269 Unspecified cataract: Secondary | ICD-10-CM | POA: Diagnosis not present

## 2020-08-30 DIAGNOSIS — E1140 Type 2 diabetes mellitus with diabetic neuropathy, unspecified: Secondary | ICD-10-CM | POA: Diagnosis not present

## 2020-08-30 DIAGNOSIS — E785 Hyperlipidemia, unspecified: Secondary | ICD-10-CM | POA: Diagnosis not present

## 2020-08-30 DIAGNOSIS — E538 Deficiency of other specified B group vitamins: Secondary | ICD-10-CM | POA: Diagnosis not present

## 2020-08-30 DIAGNOSIS — N183 Chronic kidney disease, stage 3 unspecified: Secondary | ICD-10-CM | POA: Diagnosis not present

## 2020-08-30 DIAGNOSIS — K635 Polyp of colon: Secondary | ICD-10-CM | POA: Diagnosis not present

## 2020-08-30 DIAGNOSIS — G4733 Obstructive sleep apnea (adult) (pediatric): Secondary | ICD-10-CM | POA: Diagnosis not present

## 2020-08-30 DIAGNOSIS — I639 Cerebral infarction, unspecified: Secondary | ICD-10-CM | POA: Diagnosis not present

## 2020-08-30 DIAGNOSIS — F339 Major depressive disorder, recurrent, unspecified: Secondary | ICD-10-CM | POA: Diagnosis not present

## 2020-08-30 DIAGNOSIS — D229 Melanocytic nevi, unspecified: Secondary | ICD-10-CM | POA: Diagnosis not present

## 2020-10-10 DIAGNOSIS — B351 Tinea unguium: Secondary | ICD-10-CM | POA: Diagnosis not present

## 2020-10-10 DIAGNOSIS — L03039 Cellulitis of unspecified toe: Secondary | ICD-10-CM | POA: Diagnosis not present

## 2020-10-24 ENCOUNTER — Other Ambulatory Visit: Payer: Self-pay

## 2020-10-24 ENCOUNTER — Encounter: Payer: Self-pay | Admitting: Podiatry

## 2020-10-24 ENCOUNTER — Ambulatory Visit (INDEPENDENT_AMBULATORY_CARE_PROVIDER_SITE_OTHER): Payer: Medicare Other | Admitting: Podiatry

## 2020-10-24 DIAGNOSIS — M79609 Pain in unspecified limb: Secondary | ICD-10-CM

## 2020-10-24 DIAGNOSIS — L03031 Cellulitis of right toe: Secondary | ICD-10-CM | POA: Diagnosis not present

## 2020-10-24 DIAGNOSIS — B351 Tinea unguium: Secondary | ICD-10-CM | POA: Diagnosis not present

## 2020-10-25 DIAGNOSIS — E119 Type 2 diabetes mellitus without complications: Secondary | ICD-10-CM | POA: Diagnosis not present

## 2020-10-25 DIAGNOSIS — E785 Hyperlipidemia, unspecified: Secondary | ICD-10-CM | POA: Diagnosis not present

## 2020-10-25 DIAGNOSIS — I10 Essential (primary) hypertension: Secondary | ICD-10-CM | POA: Diagnosis not present

## 2020-10-25 NOTE — Progress Notes (Signed)
Subjective:   Patient ID: David Glass, male   DOB: 70 y.o.   MRN: [id number]   HPI Patient is found to have a thickened right hallux nail that can be at times bothersome and hard to cut and loose and they wanted to know whether removal is necessary or what they could do with this.  Patient does not remember specific injury and does not smoke likes to be active   Review of Systems  All other systems reviewed and are negative.       Objective:  Physical Exam Vitals and nursing note reviewed.  Constitutional:      Appearance: He is well-developed and well-nourished.  Cardiovascular:     Pulses: Intact distal pulses.  Pulmonary:     Effort: Pulmonary effort is normal.  Musculoskeletal:        General: Normal range of motion.  Skin:    General: Skin is warm.  Neurological:     Mental Status: He is alert.     Neurovascular status was found to be intact with vitamin B12 deficiency noted with moderate diminishment sharp dull vibratory.  Patient is found to have a dystrophic thickened hallux nail right that is loose and mildly tender when I pressed but dorsal direction and difficult to cut.  Patient has good digital perfusion well oriented x3     Assessment:  Patient who has moderate health risk factors with a thickened dystrophic moderately tender hallux nail right     Plan:  H&P reviewed condition and different treatment options including oral medications topical medicines laser nail removal and debridement.  After reviewing the different treatment options we have opted for debridement smoothing the nail down which did very well with no drainage and this may need to be done periodically.  Patient is encouraged to call if it should change at all and when it becomes symptomatic will be seen back

## 2020-12-28 DIAGNOSIS — Z23 Encounter for immunization: Secondary | ICD-10-CM | POA: Diagnosis not present

## 2021-04-25 ENCOUNTER — Ambulatory Visit (INDEPENDENT_AMBULATORY_CARE_PROVIDER_SITE_OTHER): Payer: Medicare Other

## 2021-04-25 ENCOUNTER — Ambulatory Visit (INDEPENDENT_AMBULATORY_CARE_PROVIDER_SITE_OTHER): Payer: Medicare Other | Admitting: Physician Assistant

## 2021-04-25 ENCOUNTER — Telehealth: Payer: Self-pay | Admitting: *Deleted

## 2021-04-25 DIAGNOSIS — M25551 Pain in right hip: Secondary | ICD-10-CM

## 2021-04-25 DIAGNOSIS — S72001A Fracture of unspecified part of neck of right femur, initial encounter for closed fracture: Secondary | ICD-10-CM

## 2021-04-25 NOTE — Telephone Encounter (Signed)
Pt was in office needing a CT scan of hip and per [facility] Persons PA-C she recommended pt to have this done today. I had been on phone with [facility] radiology to see if can get him in today they apparently have been having phone issues so I contacted Cecille Rubin with [facility] to see if can get him in, she immediately got him scheduled and told him to come now. I went to pt gave him the information to go to [facility] right away along with adddress and pt stated he could not go today because he has something going on at his house and got upset with me stating as long as it took you to get me scheduled I thought you were going to get me scheduled for tomorrow(Saturday). I informed pt the provider wanted to have this done today and he adamantly stating he was not going. I told pt he can go and let [facility] know as well.   I contacted imaging and they can not see pt on weekends.

## 2021-04-25 NOTE — Progress Notes (Addendum)
Office Visit Note   Patient: David Glass           Date of Birth: May 24, 1951           MRN: [id number] Visit Date: 04/25/2021              Requested by: Maurice Small, MD [address],  [place] [address] PCP: Maurice Small, MD  Chief Complaint  Patient presents with   Right Hip - Pain      HPI: Patient is a 70 year old gentleman with a 2-day history of right hip pain.  He also has a history of diabetes.  He denies any previous history of hip pain until 2 days ago.  He says he was in his kitchen and he picked up his foot to stomp on a bug that was on his kitchen floor.  He then had a twisting injury and fell to the side and had immediate pain in his right hip.  He did not fall to the floor.  He does say he fell up against a cabinet in the kitchen.  Since then he has had pain which he focuses on the lateral side of his hip into his groin a little bit it does not radiate distally he denies any paresthesias any weakness.  He does not have any pain when he lays on the hip or touches the outside of the hip.  His specific pain is with any weightbearing.  Pain has increased over the last couple days  Assessment & Plan: Visit Diagnoses:  1. Pain in right hip   2. Closed fracture of neck of right femur, initial encounter ([place])     Plan: I do have some concerns for a impacted subcapital hip fracture based on radiographs today.  Also concerned that he has difficulty bearing weight because of the pain but is not tender to palpation as would be the case with a traumatic bursitis.  I have rereferred him for a stat CT scan to rule this out.  I have told him he is not to be weightbearing on this hip until the results of the CT scan are known.  I will sign this out to our call team as well. Was given an appointment to get a CAT scan this afternoon.  He refused saying he had things to do at home.  He understands the serious stiffness of this if there is indeed a fracture.  We will  arrange for a CT scan tomorrow Follow-Up Instructions: No follow-ups on file.   Ortho Exam  Patient is alert, oriented, no adenopathy, well-dressed, normal affect, normal respiratory effort. Examination of his right hip he has slight tenderness with internal and external rotation.  He is nontender to palpation over the lateral hip he does not have any ecchymosis.  His compartments are soft and nontender.  No signs of cellulitis or skin breakdown he has good strength with dorsiflexion and plantarflexion no radicular findings  Imaging: No results found. No images are attached to the encounter.  Labs: Lab Results  Component Value Date   HGBA1C 6.8 (H) 12/05/2014   HGBA1C 6.6 (H) 12/05/2014   REPTSTATUS 12/07/2014 FINAL 12/05/2014   CULT  12/05/2014    INSIGNIFICANT GROWTH Performed at [facility]      Lab Results  Component Value Date   ALBUMIN 4.0 12/05/2014    No results found for: MG No results found for: VD25OH  No results found for: PREALBUMIN CBC EXTENDED Latest Ref  Rng & Units 12/06/2014 12/05/2014 12/05/2014  WBC 4.0 - 10.5 K/uL 10.3 9.9 10.7(H)  RBC 4.22 - 5.81 MIL/uL 4.21(L) 4.31 4.31  HGB 13.0 - 17.0 g/dL 12.8(L) 12.9(L) 12.8(L)  HCT 39.0 - 52.0 % 38.3(L) 39.0 38.6(L)  PLT 150 - 400 K/uL 204 209 212  NEUTROABS 1.7 - 7.7 K/uL - - 7.9(H)  LYMPHSABS 0.7 - 4.0 K/uL - - 1.9     There is no height or weight on file to calculate BMI.  Orders:  Orders Placed This Encounter  Procedures   XR HIP UNILAT W OR W/O PELVIS 2-3 VIEWS RIGHT   CT HIP RIGHT WO CONTRAST   No orders of the defined types were placed in this encounter.    Procedures: No procedures performed  Clinical Data: No additional findings.  ROS:  All other systems negative, except as noted in the HPI. Review of Systems  Objective: Vital Signs: There were no vitals taken for this visit.  Specialty Comments:  No specialty comments available.  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 (HCC) 12/06/2014   TIA (transient ischemic attack) 12/05/2014   CKD stage 3 due to type 2 diabetes mellitus ([place]) 12/05/2014   Right sided weakness 12/05/2014   Neuropathy, peripheral 12/05/2014   B12 deficiency 12/05/2014   Diabetes mellitus without complication (HCC)    High cholesterol    Hypertension    Essential hypertension    Past Medical History:  Diagnosis Date   B12 deficiency 12/05/2014   CKD stage 3 due to type 2 diabetes mellitus ([place]) 12/05/2014   Diabetes mellitus without complication (HCC)    High cholesterol    Hypertension    Neuropathy, peripheral 12/05/2014   Stroke ([place])     Family History  Problem Relation Age of Onset   Hyperlipidemia Mother    Hypertension Mother    Hypertension Father    Hyperlipidemia Father    Stroke Brother     No past surgical history on file. Social History   Occupational History   Not on file  Tobacco Use   Smoking status: Never   Smokeless tobacco: Never  Substance and Sexual Activity   Alcohol use: Yes    Alcohol/week: 1.0 standard drink    Types: 1 Glasses of wine per week    Comment: occasional   Drug use: No   Sexual activity: Not on file

## 2021-04-29 ENCOUNTER — Telehealth: Payer: Self-pay | Admitting: *Deleted

## 2021-04-29 NOTE — Telephone Encounter (Signed)
Pt wife Trevonn Hallum called left vm stating pt has not been scheduled yet and that they are wanting someplace in [place] and NOT [employer]. Imaging has tried calling pt X2 on both numbers without success, I tried calling both numbers today left vm. Pending call back from pt.

## 2021-04-29 NOTE — Telephone Encounter (Signed)
PA MaryAnn Persons also contacted pt wife Lelon Frohlich and left vm to call as well.

## 2021-04-30 NOTE — Telephone Encounter (Signed)
Again called patient today at requested number. Left message that it is important to call . Multiple attempts and messages have been made left. Including that if his pain has increased he needs to be seen in the emergency room

## 2021-05-01 ENCOUNTER — Telehealth: Payer: Self-pay | Admitting: *Deleted

## 2021-05-01 NOTE — Telephone Encounter (Signed)
Called again today to see if pt has went elsewhere to have CT, no answer at both numbers, left vm to rc.

## 2021-05-05 ENCOUNTER — Ambulatory Visit: Payer: Self-pay

## 2021-05-05 ENCOUNTER — Encounter: Payer: Self-pay | Admitting: Orthopedic Surgery

## 2021-05-05 ENCOUNTER — Ambulatory Visit (INDEPENDENT_AMBULATORY_CARE_PROVIDER_SITE_OTHER): Payer: Medicare Other | Admitting: Physician Assistant

## 2021-05-05 ENCOUNTER — Other Ambulatory Visit: Payer: Self-pay

## 2021-05-05 DIAGNOSIS — M25551 Pain in right hip: Secondary | ICD-10-CM | POA: Diagnosis not present

## 2021-05-05 NOTE — Progress Notes (Signed)
Office Visit Note   Patient: David Glass           Date of Birth: October 09, 1950           MRN: [id number] Visit Date: 05/05/2021              Requested by: Maurice Small, MD [place],  [place] [address] PCP: Maurice Small, MD  Chief Complaint  Patient presents with   Right Hip - Follow-up      HPI: Patient follows up today on his right hip.  This was after twisting his hip hard.  He had difficulty bearing weight and with some rotation of his hip he was painful.  We did order a CAT scan that day but he declined this.  He actually says today he is back at 100% and does not have any hip pain  Assessment & Plan: Visit Diagnoses:  1. Pain in right hip     Plan: Most likely a muscle strain.  Patient thinks this is probably accurate.  He may follow-up as needed but should call if the painful symptoms return  Follow-Up Instructions: No follow-ups on file.   Ortho Exam  Patient is alert, oriented, no adenopathy, well-dressed, normal affect, normal respiratory effort. Examination of his right hip he has good internal and external rotation without pain nontender to palpation of the lateral side of the hip he ambulates with a nonantalgic gait  Imaging: No results found. No images are attached to the encounter.  Labs: Lab Results  Component Value Date   HGBA1C 6.8 (H) 12/05/2014   HGBA1C 6.6 (H) 12/05/2014   REPTSTATUS 12/07/2014 FINAL 12/05/2014   CULT  12/05/2014    INSIGNIFICANT GROWTH Performed at [facility]      Lab Results  Component Value Date   ALBUMIN 4.0 12/05/2014    No results found for: MG No results found for: VD25OH  No results found for: PREALBUMIN CBC EXTENDED Latest Ref Rng & Units 12/06/2014 12/05/2014 12/05/2014  WBC 4.0 - 10.5 K/uL 10.3 9.9 10.7(H)  RBC 4.22 - 5.81 MIL/uL 4.21(L) 4.31 4.31  HGB 13.0 - 17.0 g/dL 12.8(L) 12.9(L) 12.8(L)  HCT 39.0 - 52.0 % 38.3(L) 39.0 38.6(L)  PLT 150 - 400 K/uL 204 209 212   NEUTROABS 1.7 - 7.7 K/uL - - 7.9(H)  LYMPHSABS 0.7 - 4.0 K/uL - - 1.9     There is no height or weight on file to calculate BMI.  Orders:  Orders Placed This Encounter  Procedures   XR HIP UNILAT W OR W/O PELVIS 2-3 VIEWS RIGHT   No orders of the defined types were placed in this encounter.    Procedures: No procedures performed  Clinical Data: No additional findings.  ROS:  All other systems negative, except as noted in the HPI. Review of Systems  Objective: Vital Signs: There were no vitals taken for this visit.  Specialty Comments:  No specialty comments available.  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 ([place]) 12/06/2014   TIA (transient ischemic attack) 12/05/2014   CKD stage 3 due to type 2 diabetes mellitus ([place]) 12/05/2014   Right sided weakness 12/05/2014  Neuropathy, peripheral 12/05/2014   B12 deficiency 12/05/2014   Diabetes mellitus without complication (HCC)    High cholesterol    Hypertension    Essential hypertension    Past Medical History:  Diagnosis Date   B12 deficiency 12/05/2014   CKD stage 3 due to type 2 diabetes mellitus ([place]) 12/05/2014   Diabetes mellitus without complication (HCC)    High cholesterol    Hypertension    Neuropathy, peripheral 12/05/2014   Stroke [facility])     Family History  Problem Relation Age of Onset   Hyperlipidemia Mother    Hypertension Mother    Hypertension Father    Hyperlipidemia Father    Stroke Brother     No past surgical history on file. Social History   Occupational History   Not on file  Tobacco Use   Smoking status: Never   Smokeless tobacco:  Never  Substance and Sexual Activity   Alcohol use: Yes    Alcohol/week: 1.0 standard drink    Types: 1 Glasses of wine per week    Comment: occasional   Drug use: No   Sexual activity: Not on file

## 2021-05-09 DIAGNOSIS — G4733 Obstructive sleep apnea (adult) (pediatric): Secondary | ICD-10-CM | POA: Diagnosis not present

## 2021-05-09 DIAGNOSIS — Z0000 Encounter for general adult medical examination without abnormal findings: Secondary | ICD-10-CM | POA: Diagnosis not present

## 2021-05-09 DIAGNOSIS — Z7984 Long term (current) use of oral hypoglycemic drugs: Secondary | ICD-10-CM | POA: Diagnosis not present

## 2021-05-09 DIAGNOSIS — E538 Deficiency of other specified B group vitamins: Secondary | ICD-10-CM | POA: Diagnosis not present

## 2021-05-09 DIAGNOSIS — F339 Major depressive disorder, recurrent, unspecified: Secondary | ICD-10-CM | POA: Diagnosis not present

## 2021-05-09 DIAGNOSIS — E785 Hyperlipidemia, unspecified: Secondary | ICD-10-CM | POA: Diagnosis not present

## 2021-05-09 DIAGNOSIS — E1121 Type 2 diabetes mellitus with diabetic nephropathy: Secondary | ICD-10-CM | POA: Diagnosis not present

## 2021-05-09 DIAGNOSIS — I10 Essential (primary) hypertension: Secondary | ICD-10-CM | POA: Diagnosis not present

## 2021-05-09 DIAGNOSIS — G629 Polyneuropathy, unspecified: Secondary | ICD-10-CM | POA: Diagnosis not present

## 2021-05-09 DIAGNOSIS — F330 Major depressive disorder, recurrent, mild: Secondary | ICD-10-CM | POA: Diagnosis not present

## 2021-05-09 DIAGNOSIS — N183 Chronic kidney disease, stage 3 unspecified: Secondary | ICD-10-CM | POA: Diagnosis not present

## 2021-07-13 DIAGNOSIS — R410 Disorientation, unspecified: Secondary | ICD-10-CM | POA: Diagnosis not present

## 2021-07-13 DIAGNOSIS — I10 Essential (primary) hypertension: Secondary | ICD-10-CM | POA: Diagnosis not present

## 2021-07-13 DIAGNOSIS — E161 Other hypoglycemia: Secondary | ICD-10-CM | POA: Diagnosis not present

## 2021-07-13 DIAGNOSIS — E162 Hypoglycemia, unspecified: Secondary | ICD-10-CM | POA: Diagnosis not present

## 2021-07-13 DIAGNOSIS — R404 Transient alteration of awareness: Secondary | ICD-10-CM | POA: Diagnosis not present

## 2021-07-17 DIAGNOSIS — K625 Hemorrhage of anus and rectum: Secondary | ICD-10-CM | POA: Diagnosis not present

## 2021-07-17 DIAGNOSIS — Z8673 Personal history of transient ischemic attack (TIA), and cerebral infarction without residual deficits: Secondary | ICD-10-CM | POA: Diagnosis not present

## 2021-07-17 DIAGNOSIS — E1121 Type 2 diabetes mellitus with diabetic nephropathy: Secondary | ICD-10-CM | POA: Diagnosis not present

## 2021-07-17 DIAGNOSIS — N183 Chronic kidney disease, stage 3 unspecified: Secondary | ICD-10-CM | POA: Diagnosis not present

## 2021-07-17 DIAGNOSIS — G4733 Obstructive sleep apnea (adult) (pediatric): Secondary | ICD-10-CM | POA: Diagnosis not present

## 2021-07-17 DIAGNOSIS — F339 Major depressive disorder, recurrent, unspecified: Secondary | ICD-10-CM | POA: Diagnosis not present

## 2021-07-17 DIAGNOSIS — N2581 Secondary hyperparathyroidism of renal origin: Secondary | ICD-10-CM | POA: Diagnosis not present

## 2021-07-17 DIAGNOSIS — Z23 Encounter for immunization: Secondary | ICD-10-CM | POA: Diagnosis not present

## 2021-07-17 DIAGNOSIS — G629 Polyneuropathy, unspecified: Secondary | ICD-10-CM | POA: Diagnosis not present

## 2021-08-06 DIAGNOSIS — N183 Chronic kidney disease, stage 3 unspecified: Secondary | ICD-10-CM | POA: Diagnosis not present

## 2021-08-19 DIAGNOSIS — N183 Chronic kidney disease, stage 3 unspecified: Secondary | ICD-10-CM | POA: Diagnosis not present

## 2021-08-19 DIAGNOSIS — E1122 Type 2 diabetes mellitus with diabetic chronic kidney disease: Secondary | ICD-10-CM | POA: Diagnosis not present

## 2021-08-19 DIAGNOSIS — I129 Hypertensive chronic kidney disease with stage 1 through stage 4 chronic kidney disease, or unspecified chronic kidney disease: Secondary | ICD-10-CM | POA: Diagnosis not present

## 2021-08-21 ENCOUNTER — Other Ambulatory Visit: Payer: Self-pay | Admitting: Nephrology

## 2021-08-21 DIAGNOSIS — N1832 Chronic kidney disease, stage 3b: Secondary | ICD-10-CM

## 2021-08-25 ENCOUNTER — Ambulatory Visit
Admission: RE | Admit: 2021-08-25 | Discharge: 2021-08-25 | Disposition: A | Discharge status: Home / Self Care | Payer: Medicare Other | Source: Ambulatory Visit | Attending: Nephrology | Admitting: Nephrology

## 2021-08-25 DIAGNOSIS — N189 Chronic kidney disease, unspecified: Secondary | ICD-10-CM | POA: Diagnosis not present

## 2021-08-25 DIAGNOSIS — N1832 Chronic kidney disease, stage 3b: Secondary | ICD-10-CM

## 2021-09-02 DIAGNOSIS — Z23 Encounter for immunization: Secondary | ICD-10-CM | POA: Diagnosis not present

## 2021-10-21 DIAGNOSIS — R400 Somnolence: Secondary | ICD-10-CM | POA: Diagnosis not present

## 2021-10-21 DIAGNOSIS — I10 Essential (primary) hypertension: Secondary | ICD-10-CM | POA: Diagnosis not present

## 2021-10-21 DIAGNOSIS — F339 Major depressive disorder, recurrent, unspecified: Secondary | ICD-10-CM | POA: Diagnosis not present

## 2021-10-21 DIAGNOSIS — E1121 Type 2 diabetes mellitus with diabetic nephropathy: Secondary | ICD-10-CM | POA: Diagnosis not present

## 2021-10-21 DIAGNOSIS — G629 Polyneuropathy, unspecified: Secondary | ICD-10-CM | POA: Diagnosis not present

## 2021-10-29 DIAGNOSIS — E1121 Type 2 diabetes mellitus with diabetic nephropathy: Secondary | ICD-10-CM | POA: Diagnosis not present

## 2021-10-29 DIAGNOSIS — Z794 Long term (current) use of insulin: Secondary | ICD-10-CM | POA: Diagnosis not present

## 2021-10-29 DIAGNOSIS — L03019 Cellulitis of unspecified finger: Secondary | ICD-10-CM | POA: Diagnosis not present

## 2021-12-01 DIAGNOSIS — I129 Hypertensive chronic kidney disease with stage 1 through stage 4 chronic kidney disease, or unspecified chronic kidney disease: Secondary | ICD-10-CM | POA: Diagnosis not present

## 2021-12-01 DIAGNOSIS — N1832 Chronic kidney disease, stage 3b: Secondary | ICD-10-CM | POA: Diagnosis not present

## 2021-12-01 DIAGNOSIS — E1122 Type 2 diabetes mellitus with diabetic chronic kidney disease: Secondary | ICD-10-CM | POA: Diagnosis not present

## 2022-01-01 DIAGNOSIS — E119 Type 2 diabetes mellitus without complications: Secondary | ICD-10-CM | POA: Diagnosis not present

## 2022-01-19 DIAGNOSIS — Z794 Long term (current) use of insulin: Secondary | ICD-10-CM | POA: Diagnosis not present

## 2022-01-19 DIAGNOSIS — E1122 Type 2 diabetes mellitus with diabetic chronic kidney disease: Secondary | ICD-10-CM | POA: Diagnosis not present

## 2022-01-19 DIAGNOSIS — N184 Chronic kidney disease, stage 4 (severe): Secondary | ICD-10-CM | POA: Diagnosis not present

## 2022-02-25 DIAGNOSIS — N1832 Chronic kidney disease, stage 3b: Secondary | ICD-10-CM | POA: Diagnosis not present

## 2022-03-02 DIAGNOSIS — N1832 Chronic kidney disease, stage 3b: Secondary | ICD-10-CM | POA: Diagnosis not present

## 2022-03-02 DIAGNOSIS — I129 Hypertensive chronic kidney disease with stage 1 through stage 4 chronic kidney disease, or unspecified chronic kidney disease: Secondary | ICD-10-CM | POA: Diagnosis not present

## 2022-03-02 DIAGNOSIS — E1122 Type 2 diabetes mellitus with diabetic chronic kidney disease: Secondary | ICD-10-CM | POA: Diagnosis not present

## 2022-03-16 DIAGNOSIS — Z23 Encounter for immunization: Secondary | ICD-10-CM | POA: Diagnosis not present

## 2022-06-04 DIAGNOSIS — Z599 Problem related to housing and economic circumstances, unspecified: Secondary | ICD-10-CM | POA: Diagnosis not present

## 2022-06-04 DIAGNOSIS — Z0000 Encounter for general adult medical examination without abnormal findings: Secondary | ICD-10-CM | POA: Diagnosis not present

## 2022-06-04 DIAGNOSIS — N184 Chronic kidney disease, stage 4 (severe): Secondary | ICD-10-CM | POA: Diagnosis not present

## 2022-06-04 DIAGNOSIS — R5383 Other fatigue: Secondary | ICD-10-CM | POA: Diagnosis not present

## 2022-06-04 DIAGNOSIS — Z1159 Encounter for screening for other viral diseases: Secondary | ICD-10-CM | POA: Diagnosis not present

## 2022-06-04 DIAGNOSIS — E538 Deficiency of other specified B group vitamins: Secondary | ICD-10-CM | POA: Diagnosis not present

## 2022-06-04 DIAGNOSIS — F339 Major depressive disorder, recurrent, unspecified: Secondary | ICD-10-CM | POA: Diagnosis not present

## 2022-06-04 DIAGNOSIS — I10 Essential (primary) hypertension: Secondary | ICD-10-CM | POA: Diagnosis not present

## 2022-06-04 DIAGNOSIS — E1121 Type 2 diabetes mellitus with diabetic nephropathy: Secondary | ICD-10-CM | POA: Diagnosis not present

## 2022-06-04 DIAGNOSIS — Z125 Encounter for screening for malignant neoplasm of prostate: Secondary | ICD-10-CM | POA: Diagnosis not present

## 2022-06-04 DIAGNOSIS — E785 Hyperlipidemia, unspecified: Secondary | ICD-10-CM | POA: Diagnosis not present

## 2022-06-04 DIAGNOSIS — G629 Polyneuropathy, unspecified: Secondary | ICD-10-CM | POA: Diagnosis not present

## 2022-07-14 DIAGNOSIS — N1832 Chronic kidney disease, stage 3b: Secondary | ICD-10-CM | POA: Diagnosis not present

## 2022-07-17 DIAGNOSIS — N1832 Chronic kidney disease, stage 3b: Secondary | ICD-10-CM | POA: Diagnosis not present

## 2022-07-17 DIAGNOSIS — E1122 Type 2 diabetes mellitus with diabetic chronic kidney disease: Secondary | ICD-10-CM | POA: Diagnosis not present

## 2022-07-17 DIAGNOSIS — I129 Hypertensive chronic kidney disease with stage 1 through stage 4 chronic kidney disease, or unspecified chronic kidney disease: Secondary | ICD-10-CM | POA: Diagnosis not present

## 2022-07-21 DIAGNOSIS — Z23 Encounter for immunization: Secondary | ICD-10-CM | POA: Diagnosis not present

## 2022-07-28 ENCOUNTER — Telehealth: Payer: Self-pay | Admitting: *Deleted

## 2022-07-28 NOTE — Patient Outreach (Signed)
  Care Coordination   07/28/2022 Name: David Glass MRN: [id number] DOB: [birth year]   Care Coordination Outreach Attempts:  An unsuccessful telephone outreach was attempted today to offer the patient information about available care coordination services as a benefit of their health plan.   Follow Up Plan:  Additional outreach attempts will be made to offer the patient care coordination information and services.   Encounter Outcome:  No Answer  Care Coordination Interventions Activated:  No   Care Coordination Interventions:  No, not indicated    Raina Mina, RN Care Management Coordinator [place] Office [phone]

## 2022-09-07 DIAGNOSIS — I10 Essential (primary) hypertension: Secondary | ICD-10-CM | POA: Diagnosis not present

## 2022-09-07 DIAGNOSIS — E1121 Type 2 diabetes mellitus with diabetic nephropathy: Secondary | ICD-10-CM | POA: Diagnosis not present

## 2023-06-08 DIAGNOSIS — E119 Type 2 diabetes mellitus without complications: Secondary | ICD-10-CM | POA: Diagnosis not present

## 2023-06-23 DIAGNOSIS — N1832 Chronic kidney disease, stage 3b: Secondary | ICD-10-CM | POA: Diagnosis not present

## 2023-06-30 DIAGNOSIS — N1832 Chronic kidney disease, stage 3b: Secondary | ICD-10-CM | POA: Diagnosis not present

## 2023-06-30 DIAGNOSIS — I129 Hypertensive chronic kidney disease with stage 1 through stage 4 chronic kidney disease, or unspecified chronic kidney disease: Secondary | ICD-10-CM | POA: Diagnosis not present

## 2023-06-30 DIAGNOSIS — E1122 Type 2 diabetes mellitus with diabetic chronic kidney disease: Secondary | ICD-10-CM | POA: Diagnosis not present

## 2023-07-14 DIAGNOSIS — Z0000 Encounter for general adult medical examination without abnormal findings: Secondary | ICD-10-CM | POA: Diagnosis not present

## 2023-07-14 DIAGNOSIS — E1122 Type 2 diabetes mellitus with diabetic chronic kidney disease: Secondary | ICD-10-CM | POA: Diagnosis not present

## 2023-07-14 DIAGNOSIS — F339 Major depressive disorder, recurrent, unspecified: Secondary | ICD-10-CM | POA: Diagnosis not present

## 2023-07-14 DIAGNOSIS — N184 Chronic kidney disease, stage 4 (severe): Secondary | ICD-10-CM | POA: Diagnosis not present

## 2023-07-14 DIAGNOSIS — Z125 Encounter for screening for malignant neoplasm of prostate: Secondary | ICD-10-CM | POA: Diagnosis not present

## 2023-07-14 DIAGNOSIS — Z23 Encounter for immunization: Secondary | ICD-10-CM | POA: Diagnosis not present

## 2023-07-14 DIAGNOSIS — I10 Essential (primary) hypertension: Secondary | ICD-10-CM | POA: Diagnosis not present

## 2023-07-14 DIAGNOSIS — E1165 Type 2 diabetes mellitus with hyperglycemia: Secondary | ICD-10-CM | POA: Diagnosis not present

## 2023-07-14 DIAGNOSIS — E785 Hyperlipidemia, unspecified: Secondary | ICD-10-CM | POA: Diagnosis not present

## 2023-07-14 DIAGNOSIS — G629 Polyneuropathy, unspecified: Secondary | ICD-10-CM | POA: Diagnosis not present

## 2023-08-10 ENCOUNTER — Other Ambulatory Visit: Payer: Self-pay

## 2023-08-10 ENCOUNTER — Emergency Department (HOSPITAL_COMMUNITY)
Admission: EM | Admit: 2023-08-10 | Discharge: 2023-08-10 | Discharge status: Against Medical Advice | Payer: Medicare PPO | Attending: Emergency Medicine | Admitting: Emergency Medicine

## 2023-08-10 ENCOUNTER — Encounter (HOSPITAL_COMMUNITY): Payer: Self-pay | Admitting: Emergency Medicine

## 2023-08-10 DIAGNOSIS — E1140 Type 2 diabetes mellitus with diabetic neuropathy, unspecified: Secondary | ICD-10-CM | POA: Diagnosis not present

## 2023-08-10 DIAGNOSIS — Z8673 Personal history of transient ischemic attack (TIA), and cerebral infarction without residual deficits: Secondary | ICD-10-CM | POA: Insufficient documentation

## 2023-08-10 DIAGNOSIS — R42 Dizziness and giddiness: Secondary | ICD-10-CM | POA: Insufficient documentation

## 2023-08-10 DIAGNOSIS — Z5321 Procedure and treatment not carried out due to patient leaving prior to being seen by health care provider: Secondary | ICD-10-CM | POA: Diagnosis not present

## 2023-08-10 LAB — COMPREHENSIVE METABOLIC PANEL
ALT: 20 U/L (ref 0–44)
AST: 16 U/L (ref 15–41)
Albumin: 3.9 g/dL (ref 3.5–5.0)
Alkaline Phosphatase: 103 U/L (ref 38–126)
Anion gap: 10 (ref 5–15)
BUN: 33 mg/dL — ABNORMAL HIGH (ref 8–23)
CO2: 21 mmol/L — ABNORMAL LOW (ref 22–32)
Calcium: 8.8 mg/dL — ABNORMAL LOW (ref 8.9–10.3)
Chloride: 108 mmol/L (ref 98–111)
Creatinine, Ser: 2.21 mg/dL — ABNORMAL HIGH (ref 0.61–1.24)
GFR, Estimated: 30 mL/min — ABNORMAL LOW (ref >60)
Glucose, Bld: 233 mg/dL — ABNORMAL HIGH (ref 70–99)
Potassium: 3.8 mmol/L (ref 3.5–5.1)
Sodium: 139 mmol/L (ref 135–145)
Total Bilirubin: 0.7 mg/dL (ref <1.2)
Total Protein: 7.4 g/dL (ref 6.5–8.1)

## 2023-08-10 LAB — APTT: aPTT: 25 s (ref 24–36)

## 2023-08-10 LAB — CBC
HCT: 51 % (ref 39.0–52.0)
Hemoglobin: 16.9 g/dL (ref 13.0–17.0)
MCH: 29.2 pg (ref 26.0–34.0)
MCHC: 33.1 g/dL (ref 30.0–36.0)
MCV: 88.2 fL (ref 80.0–100.0)
Platelets: 157 10*3/uL (ref 150–400)
RBC: 5.78 MIL/uL (ref 4.22–5.81)
RDW: 13.8 % (ref 11.5–15.5)
WBC: 8.9 10*3/uL (ref 4.0–10.5)
nRBC: 0 % (ref 0.0–0.2)

## 2023-08-10 LAB — PROTIME-INR
INR: 1 (ref 0.8–1.2)
Prothrombin Time: 13 s (ref 11.4–15.2)

## 2023-08-10 LAB — DIFFERENTIAL
Abs Immature Granulocytes: 0.02 10*3/uL (ref 0.00–0.07)
Basophils Absolute: 0.1 10*3/uL (ref 0.0–0.1)
Basophils Relative: 1 %
Eosinophils Absolute: 0.3 10*3/uL (ref 0.0–0.5)
Eosinophils Relative: 3 %
Immature Granulocytes: 0 %
Lymphocytes Relative: 19 %
Lymphs Abs: 1.7 10*3/uL (ref 0.7–4.0)
Monocytes Absolute: 0.6 10*3/uL (ref 0.1–1.0)
Monocytes Relative: 6 %
Neutro Abs: 6.3 10*3/uL (ref 1.7–7.7)
Neutrophils Relative %: 71 %

## 2023-08-10 LAB — CBG MONITORING, ED: Glucose-Capillary: 219 mg/dL — ABNORMAL HIGH (ref 70–99)

## 2023-08-10 LAB — ETHANOL: Alcohol, Ethyl (B): <10 mg/dL (ref <10)

## 2023-08-10 NOTE — ED Triage Notes (Signed)
Patient arrives ambulatory by POV c/o dizziness, balance problems and feeling cloudy in head x 1 week. Patient has equal grip strength. Decreased sensory to left side- patient states he has hx of 2 strokes and with that plus his diabetic neuropathy it is normal for him.

## 2023-08-10 NOTE — ED Provider Triage Note (Signed)
Emergency Medicine Provider Triage Evaluation Note  David Glass , a 72 y.o. male  was evaluated in triage.  Pt complains of a number of complaints.  The patient reports that for the last 1 week he has been dizzy, lightheaded, "foggy headed".  He states that the symptoms have been on and off for the last 6 months to 1 year.  He reports that they have all "clumped together" in the last 1 week and this is what caused concern.  He denies any chest pain, shortness of breath, one-sided weakness or numbness, nausea or vomiting.  He states he has mentioned all these issues to his PCP but his PCP has not done anything about them.  Will defer on imaging to provider in the back.  Review of Systems  Positive:  Negative:   Physical Exam  BP (!) 156/80 (BP Location: Left Arm)   Pulse 60   Temp 98.3 F (36.8 C) (Oral)   Resp 17   Ht 6\' 2"  (1.88 m)   Wt 106.6 kg   SpO2 98%   BMI 30.17 kg/m  Gen:   Awake, no distress   Resp:  Normal effort  MSK:   Moves extremities without difficulty  Other:  Reassuring neurological examination  Medical Decision Making  Medically screening exam initiated at 4:29 PM.  Appropriate orders placed.  Evlyn Courier Perin was informed that the remainder of the evaluation will be completed by another provider, this initial triage assessment does not replace that evaluation, and the importance of remaining in the ED until their evaluation is complete.     Al Decant, PA-C 08/10/23 [phone]

## 2023-12-22 DIAGNOSIS — N1832 Chronic kidney disease, stage 3b: Secondary | ICD-10-CM | POA: Diagnosis not present

## 2023-12-29 DIAGNOSIS — I129 Hypertensive chronic kidney disease with stage 1 through stage 4 chronic kidney disease, or unspecified chronic kidney disease: Secondary | ICD-10-CM | POA: Diagnosis not present

## 2023-12-29 DIAGNOSIS — N1832 Chronic kidney disease, stage 3b: Secondary | ICD-10-CM | POA: Diagnosis not present

## 2023-12-29 DIAGNOSIS — E1122 Type 2 diabetes mellitus with diabetic chronic kidney disease: Secondary | ICD-10-CM | POA: Diagnosis not present

## 2024-02-23 DIAGNOSIS — N184 Chronic kidney disease, stage 4 (severe): Secondary | ICD-10-CM | POA: Diagnosis not present

## 2024-02-23 DIAGNOSIS — R5383 Other fatigue: Secondary | ICD-10-CM | POA: Diagnosis not present

## 2024-02-23 DIAGNOSIS — E1121 Type 2 diabetes mellitus with diabetic nephropathy: Secondary | ICD-10-CM | POA: Diagnosis not present

## 2024-02-23 DIAGNOSIS — I10 Essential (primary) hypertension: Secondary | ICD-10-CM | POA: Diagnosis not present

## 2024-02-23 DIAGNOSIS — Z8673 Personal history of transient ischemic attack (TIA), and cerebral infarction without residual deficits: Secondary | ICD-10-CM | POA: Diagnosis not present

## 2024-03-07 ENCOUNTER — Other Ambulatory Visit: Payer: Self-pay | Admitting: Family Medicine

## 2024-03-07 DIAGNOSIS — Z8673 Personal history of transient ischemic attack (TIA), and cerebral infarction without residual deficits: Secondary | ICD-10-CM

## 2024-04-17 DIAGNOSIS — I10 Essential (primary) hypertension: Secondary | ICD-10-CM | POA: Diagnosis not present

## 2024-04-17 DIAGNOSIS — L989 Disorder of the skin and subcutaneous tissue, unspecified: Secondary | ICD-10-CM | POA: Diagnosis not present

## 2024-04-17 DIAGNOSIS — R5383 Other fatigue: Secondary | ICD-10-CM | POA: Diagnosis not present

## 2024-04-17 DIAGNOSIS — E1121 Type 2 diabetes mellitus with diabetic nephropathy: Secondary | ICD-10-CM | POA: Diagnosis not present

## 2024-06-16 DIAGNOSIS — E119 Type 2 diabetes mellitus without complications: Secondary | ICD-10-CM | POA: Diagnosis not present

## 2024-06-28 DIAGNOSIS — N1832 Chronic kidney disease, stage 3b: Secondary | ICD-10-CM | POA: Diagnosis not present

## 2024-07-04 DIAGNOSIS — N184 Chronic kidney disease, stage 4 (severe): Secondary | ICD-10-CM | POA: Diagnosis not present

## 2024-07-04 DIAGNOSIS — Z23 Encounter for immunization: Secondary | ICD-10-CM | POA: Diagnosis not present

## 2024-07-04 DIAGNOSIS — R531 Weakness: Secondary | ICD-10-CM | POA: Diagnosis not present

## 2024-07-04 DIAGNOSIS — E1122 Type 2 diabetes mellitus with diabetic chronic kidney disease: Secondary | ICD-10-CM | POA: Diagnosis not present

## 2024-07-04 DIAGNOSIS — I129 Hypertensive chronic kidney disease with stage 1 through stage 4 chronic kidney disease, or unspecified chronic kidney disease: Secondary | ICD-10-CM | POA: Diagnosis not present

## 2024-07-18 DIAGNOSIS — N184 Chronic kidney disease, stage 4 (severe): Secondary | ICD-10-CM | POA: Diagnosis not present

## 2024-07-18 DIAGNOSIS — I10 Essential (primary) hypertension: Secondary | ICD-10-CM | POA: Diagnosis not present

## 2024-07-18 DIAGNOSIS — L989 Disorder of the skin and subcutaneous tissue, unspecified: Secondary | ICD-10-CM | POA: Diagnosis not present

## 2024-07-18 DIAGNOSIS — R5383 Other fatigue: Secondary | ICD-10-CM | POA: Diagnosis not present

## 2024-07-18 DIAGNOSIS — E1165 Type 2 diabetes mellitus with hyperglycemia: Secondary | ICD-10-CM | POA: Diagnosis not present

## 2024-07-18 DIAGNOSIS — E1121 Type 2 diabetes mellitus with diabetic nephropathy: Secondary | ICD-10-CM | POA: Diagnosis not present

## 2024-07-18 DIAGNOSIS — E559 Vitamin D deficiency, unspecified: Secondary | ICD-10-CM | POA: Diagnosis not present

## 2024-07-18 DIAGNOSIS — E785 Hyperlipidemia, unspecified: Secondary | ICD-10-CM | POA: Diagnosis not present

## 2024-07-18 DIAGNOSIS — Z0000 Encounter for general adult medical examination without abnormal findings: Secondary | ICD-10-CM | POA: Diagnosis not present

## 2024-07-18 DIAGNOSIS — G629 Polyneuropathy, unspecified: Secondary | ICD-10-CM | POA: Diagnosis not present

## 2024-07-18 DIAGNOSIS — Z125 Encounter for screening for malignant neoplasm of prostate: Secondary | ICD-10-CM | POA: Diagnosis not present
